# Patient Record
Sex: Female | Born: 1975 | Race: Black or African American | Hispanic: No | Marital: Single | Smoking: Current every day smoker
Health system: Southern US, Community
[De-identification: ages and names within clinical notes are randomized; demographics above are authoritative.]

## PROBLEM LIST (undated history)

## (undated) DIAGNOSIS — F32A Depression, unspecified: Secondary | ICD-10-CM

## (undated) DIAGNOSIS — F419 Anxiety disorder, unspecified: Secondary | ICD-10-CM

## (undated) DIAGNOSIS — F319 Bipolar disorder, unspecified: Secondary | ICD-10-CM

## (undated) DIAGNOSIS — F329 Major depressive disorder, single episode, unspecified: Secondary | ICD-10-CM

## (undated) DIAGNOSIS — R569 Unspecified convulsions: Secondary | ICD-10-CM

## (undated) DIAGNOSIS — J45909 Unspecified asthma, uncomplicated: Secondary | ICD-10-CM

## (undated) HISTORY — PX: TUBAL LIGATION: SHX77

---

## 1998-01-17 ENCOUNTER — Inpatient Hospital Stay (HOSPITAL_COMMUNITY): Admission: AD | Admit: 1998-01-17 | Discharge: 1998-01-17 | Payer: Self-pay | Admitting: Obstetrics

## 1998-03-23 ENCOUNTER — Inpatient Hospital Stay (HOSPITAL_COMMUNITY): Admission: AD | Admit: 1998-03-23 | Discharge: 1998-03-23 | Payer: Self-pay | Admitting: Obstetrics

## 1998-03-23 ENCOUNTER — Emergency Department (HOSPITAL_COMMUNITY): Admission: EM | Admit: 1998-03-23 | Discharge: 1998-03-23 | Payer: Self-pay | Admitting: Emergency Medicine

## 1999-07-11 ENCOUNTER — Inpatient Hospital Stay (HOSPITAL_COMMUNITY): Admission: AD | Admit: 1999-07-11 | Discharge: 1999-07-11 | Payer: Self-pay | Admitting: Obstetrics

## 1999-07-20 ENCOUNTER — Ambulatory Visit (HOSPITAL_COMMUNITY): Admission: RE | Admit: 1999-07-20 | Discharge: 1999-07-20 | Payer: Self-pay | Admitting: Obstetrics & Gynecology

## 1999-07-25 ENCOUNTER — Inpatient Hospital Stay (HOSPITAL_COMMUNITY): Admission: AD | Admit: 1999-07-25 | Discharge: 1999-07-25 | Payer: Self-pay | Admitting: *Deleted

## 2001-09-27 ENCOUNTER — Inpatient Hospital Stay (HOSPITAL_COMMUNITY): Admission: EM | Admit: 2001-09-27 | Discharge: 2001-10-01 | Payer: Self-pay | Admitting: Psychiatry

## 2001-12-27 ENCOUNTER — Emergency Department (HOSPITAL_COMMUNITY): Admission: EM | Admit: 2001-12-27 | Discharge: 2001-12-27 | Payer: Self-pay | Admitting: Emergency Medicine

## 2002-05-24 ENCOUNTER — Emergency Department (HOSPITAL_COMMUNITY): Admission: EM | Admit: 2002-05-24 | Discharge: 2002-05-24 | Payer: Self-pay | Admitting: Emergency Medicine

## 2002-09-06 ENCOUNTER — Emergency Department (HOSPITAL_COMMUNITY): Admission: EM | Admit: 2002-09-06 | Discharge: 2002-09-06 | Payer: Self-pay

## 2002-11-30 ENCOUNTER — Emergency Department (HOSPITAL_COMMUNITY): Admission: EM | Admit: 2002-11-30 | Discharge: 2002-11-30 | Payer: Self-pay | Admitting: Emergency Medicine

## 2004-05-29 ENCOUNTER — Emergency Department (HOSPITAL_COMMUNITY): Admission: EM | Admit: 2004-05-29 | Discharge: 2004-05-29 | Payer: Self-pay | Admitting: Emergency Medicine

## 2004-12-02 ENCOUNTER — Emergency Department (HOSPITAL_COMMUNITY): Admission: AD | Admit: 2004-12-02 | Discharge: 2004-12-02 | Payer: Self-pay | Admitting: Emergency Medicine

## 2004-12-15 ENCOUNTER — Inpatient Hospital Stay (HOSPITAL_COMMUNITY): Admission: AD | Admit: 2004-12-15 | Discharge: 2004-12-15 | Payer: Self-pay | Admitting: Obstetrics

## 2005-01-08 ENCOUNTER — Emergency Department (HOSPITAL_COMMUNITY): Admission: EM | Admit: 2005-01-08 | Discharge: 2005-01-08 | Payer: Self-pay | Admitting: Emergency Medicine

## 2005-06-19 ENCOUNTER — Emergency Department (HOSPITAL_COMMUNITY): Admission: EM | Admit: 2005-06-19 | Discharge: 2005-06-20 | Payer: Self-pay | Admitting: Emergency Medicine

## 2005-07-03 ENCOUNTER — Emergency Department (HOSPITAL_COMMUNITY): Admission: EM | Admit: 2005-07-03 | Discharge: 2005-07-04 | Payer: Self-pay | Admitting: Emergency Medicine

## 2005-12-16 ENCOUNTER — Emergency Department (HOSPITAL_COMMUNITY): Admission: EM | Admit: 2005-12-16 | Discharge: 2005-12-16 | Payer: Self-pay | Admitting: Emergency Medicine

## 2006-05-03 ENCOUNTER — Emergency Department (HOSPITAL_COMMUNITY): Admission: EM | Admit: 2006-05-03 | Discharge: 2006-05-03 | Payer: Self-pay | Admitting: *Deleted

## 2006-06-08 ENCOUNTER — Emergency Department (HOSPITAL_COMMUNITY): Admission: EM | Admit: 2006-06-08 | Discharge: 2006-06-08 | Payer: Self-pay | Admitting: Family Medicine

## 2006-06-23 ENCOUNTER — Emergency Department (HOSPITAL_COMMUNITY): Admission: EM | Admit: 2006-06-23 | Discharge: 2006-06-23 | Payer: Self-pay | Admitting: Emergency Medicine

## 2006-09-11 ENCOUNTER — Emergency Department (HOSPITAL_COMMUNITY): Admission: EM | Admit: 2006-09-11 | Discharge: 2006-09-11 | Payer: Self-pay | Admitting: Emergency Medicine

## 2007-05-21 ENCOUNTER — Emergency Department (HOSPITAL_COMMUNITY): Admission: EM | Admit: 2007-05-21 | Discharge: 2007-05-22 | Payer: Self-pay | Admitting: Emergency Medicine

## 2008-12-15 ENCOUNTER — Emergency Department (HOSPITAL_COMMUNITY): Admission: EM | Admit: 2008-12-15 | Discharge: 2008-12-15 | Payer: Self-pay | Admitting: Emergency Medicine

## 2009-05-11 ENCOUNTER — Emergency Department (HOSPITAL_COMMUNITY): Admission: EM | Admit: 2009-05-11 | Discharge: 2009-05-11 | Payer: Self-pay | Admitting: Emergency Medicine

## 2009-12-10 ENCOUNTER — Emergency Department (HOSPITAL_COMMUNITY): Admission: EM | Admit: 2009-12-10 | Discharge: 2009-12-11 | Payer: Self-pay | Admitting: Emergency Medicine

## 2010-09-22 ENCOUNTER — Ambulatory Visit: Payer: Self-pay | Admitting: Nurse Practitioner

## 2011-01-03 ENCOUNTER — Emergency Department (HOSPITAL_COMMUNITY)
Admission: EM | Admit: 2011-01-03 | Discharge: 2011-01-04 | Disposition: A | Discharge status: Home / Self Care | Payer: Medicaid Other | Attending: Emergency Medicine | Admitting: Emergency Medicine

## 2011-01-03 DIAGNOSIS — F329 Major depressive disorder, single episode, unspecified: Secondary | ICD-10-CM | POA: Insufficient documentation

## 2011-01-03 DIAGNOSIS — F3289 Other specified depressive episodes: Secondary | ICD-10-CM | POA: Insufficient documentation

## 2011-01-03 DIAGNOSIS — R51 Headache: Secondary | ICD-10-CM | POA: Insufficient documentation

## 2011-01-03 DIAGNOSIS — M25569 Pain in unspecified knee: Secondary | ICD-10-CM | POA: Insufficient documentation

## 2011-01-03 DIAGNOSIS — D72829 Elevated white blood cell count, unspecified: Secondary | ICD-10-CM | POA: Insufficient documentation

## 2011-01-03 DIAGNOSIS — G40909 Epilepsy, unspecified, not intractable, without status epilepticus: Secondary | ICD-10-CM | POA: Insufficient documentation

## 2011-01-03 DIAGNOSIS — R112 Nausea with vomiting, unspecified: Secondary | ICD-10-CM | POA: Insufficient documentation

## 2011-01-03 DIAGNOSIS — R0609 Other forms of dyspnea: Secondary | ICD-10-CM | POA: Insufficient documentation

## 2011-01-03 DIAGNOSIS — Z79899 Other long term (current) drug therapy: Secondary | ICD-10-CM | POA: Insufficient documentation

## 2011-01-03 DIAGNOSIS — J45909 Unspecified asthma, uncomplicated: Secondary | ICD-10-CM | POA: Insufficient documentation

## 2011-01-03 DIAGNOSIS — R0989 Other specified symptoms and signs involving the circulatory and respiratory systems: Secondary | ICD-10-CM | POA: Insufficient documentation

## 2011-01-03 DIAGNOSIS — R079 Chest pain, unspecified: Secondary | ICD-10-CM | POA: Insufficient documentation

## 2011-01-03 DIAGNOSIS — M79609 Pain in unspecified limb: Secondary | ICD-10-CM | POA: Insufficient documentation

## 2011-01-04 ENCOUNTER — Emergency Department (HOSPITAL_COMMUNITY): Payer: Medicaid Other

## 2011-01-04 LAB — POCT CARDIAC MARKERS
CKMB, poc: <1 ng/mL — ABNORMAL LOW (ref 1.0–8.0)
Myoglobin, poc: 39.2 ng/mL (ref 12–200)
Myoglobin, poc: 41.3 ng/mL (ref 12–200)
Troponin i, poc: <0.05 ng/mL (ref 0.00–0.09)

## 2011-01-04 LAB — DIFFERENTIAL
Basophils Absolute: 0 10*3/uL (ref 0.0–0.1)
Eosinophils Relative: 0 % (ref 0–5)
Lymphs Abs: 2.8 10*3/uL (ref 0.7–4.0)
Monocytes Absolute: 1.6 10*3/uL — ABNORMAL HIGH (ref 0.1–1.0)
Monocytes Relative: 8 % (ref 3–12)
Neutrophils Relative %: 78 % — ABNORMAL HIGH (ref 43–77)

## 2011-01-04 LAB — POCT I-STAT, CHEM 8
BUN: 9 mg/dL (ref 6–23)
Calcium, Ion: 1.14 mmol/L (ref 1.12–1.32)
Chloride: 104 mEq/L (ref 96–112)
HCT: 41 % (ref 36.0–46.0)
Sodium: 140 mEq/L (ref 135–145)

## 2011-01-04 LAB — CBC
HCT: 34.9 % — ABNORMAL LOW (ref 36.0–46.0)
MCV: 76.2 fL — ABNORMAL LOW (ref 78.0–100.0)
Platelets: 305 10*3/uL (ref 150–400)
RBC: 4.58 MIL/uL (ref 3.87–5.11)
RDW: 14.2 % (ref 11.5–15.5)
WBC: 20 10*3/uL — ABNORMAL HIGH (ref 4.0–10.5)

## 2011-01-15 LAB — POCT I-STAT, CHEM 8
BUN: 4 mg/dL — ABNORMAL LOW (ref 6–23)
Calcium, Ion: 1.09 mmol/L — ABNORMAL LOW (ref 1.12–1.32)
Chloride: 104 mEq/L (ref 96–112)
Creatinine, Ser: 0.7 mg/dL (ref 0.4–1.2)

## 2011-01-15 LAB — URINALYSIS, ROUTINE W REFLEX MICROSCOPIC
Glucose, UA: NEGATIVE mg/dL
Hgb urine dipstick: NEGATIVE
pH: 6 (ref 5.0–8.0)

## 2011-01-15 LAB — DIFFERENTIAL
Basophils Absolute: 0.1 10*3/uL (ref 0.0–0.1)
Eosinophils Relative: 1 % (ref 0–5)
Lymphocytes Relative: 46 % (ref 12–46)
Lymphs Abs: 5 10*3/uL — ABNORMAL HIGH (ref 0.7–4.0)
Monocytes Absolute: 0.4 10*3/uL (ref 0.1–1.0)
Monocytes Relative: 4 % (ref 3–12)
Neutro Abs: 5.4 10*3/uL (ref 1.7–7.7)

## 2011-01-15 LAB — CBC
HCT: 35.9 % — ABNORMAL LOW (ref 36.0–46.0)
Hemoglobin: 11.4 g/dL — ABNORMAL LOW (ref 12.0–15.0)
RBC: 4.41 MIL/uL (ref 3.87–5.11)
RDW: 14.4 % (ref 11.5–15.5)
WBC: 11 10*3/uL — ABNORMAL HIGH (ref 4.0–10.5)

## 2011-01-15 LAB — POCT PREGNANCY, URINE: Preg Test, Ur: NEGATIVE
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
## 2011-07-25 LAB — COMPREHENSIVE METABOLIC PANEL
Alkaline Phosphatase: 43
BUN: 6
Chloride: 110
Creatinine, Ser: 0.7
Glucose, Bld: 97
Potassium: 4.1
Total Bilirubin: 0.3
Total Protein: 7.2

## 2011-07-25 LAB — POCT PREGNANCY, URINE: Operator id: 27011

## 2011-07-25 LAB — DIFFERENTIAL
Basophils Absolute: 0.2 — ABNORMAL HIGH
Basophils Relative: 2 — ABNORMAL HIGH
Neutro Abs: 5.8
Neutrophils Relative %: 47

## 2011-07-25 LAB — URINALYSIS, ROUTINE W REFLEX MICROSCOPIC
Ketones, ur: NEGATIVE
Nitrite: NEGATIVE
Protein, ur: NEGATIVE
Urobilinogen, UA: 0.2

## 2011-07-25 LAB — CBC
HCT: 33.6 — ABNORMAL LOW
Hemoglobin: 10.7 — ABNORMAL LOW
MCV: 79.5
RDW: 14.8 — ABNORMAL HIGH

## 2011-07-25 LAB — RAPID URINE DRUG SCREEN, HOSP PERFORMED
Benzodiazepines: NOT DETECTED
Cocaine: NOT DETECTED
Tetrahydrocannabinol: POSITIVE — AB

## 2011-07-25 LAB — ETHANOL: Alcohol, Ethyl (B): 217 — ABNORMAL HIGH

## 2012-04-22 ENCOUNTER — Encounter (HOSPITAL_COMMUNITY): Payer: Self-pay | Admitting: Emergency Medicine

## 2012-04-22 ENCOUNTER — Emergency Department (HOSPITAL_COMMUNITY): Payer: Medicaid Other

## 2012-04-22 ENCOUNTER — Emergency Department (HOSPITAL_COMMUNITY)
Admission: EM | Admit: 2012-04-22 | Discharge: 2012-04-22 | Disposition: A | Discharge status: Home / Self Care | Payer: Medicaid Other | Attending: Emergency Medicine | Admitting: Emergency Medicine

## 2012-04-22 DIAGNOSIS — IMO0002 Reserved for concepts with insufficient information to code with codable children: Secondary | ICD-10-CM | POA: Insufficient documentation

## 2012-04-22 DIAGNOSIS — S0990XA Unspecified injury of head, initial encounter: Secondary | ICD-10-CM | POA: Insufficient documentation

## 2012-04-22 DIAGNOSIS — T07XXXA Unspecified multiple injuries, initial encounter: Secondary | ICD-10-CM | POA: Insufficient documentation

## 2012-04-22 MED ORDER — ACETAMINOPHEN 325 MG PO TABS
ORAL_TABLET | ORAL | Status: AC
  Administered 2012-04-22: 650 mg via ORAL
  Filled 2012-04-22: qty 2

## 2012-04-22 MED ORDER — OXYCODONE-ACETAMINOPHEN 5-325 MG PO TABS
2.0000 | ORAL_TABLET | Freq: Once | ORAL | Status: AC
  Administered 2012-04-22: 2 via ORAL
  Filled 2012-04-22: qty 2

## 2012-04-22 MED ORDER — ACETAMINOPHEN 325 MG PO TABS
650.0000 mg | ORAL_TABLET | Freq: Once | ORAL | Status: AC
  Administered 2012-04-22: 650 mg via ORAL

## 2012-04-22 MED ORDER — BACITRACIN ZINC 500 UNIT/GM EX OINT
1.0000 "application " | TOPICAL_OINTMENT | Freq: Two times a day (BID) | CUTANEOUS | Status: DC
  Administered 2012-04-22: 1 via TOPICAL
  Filled 2012-04-22 (×2): qty 0.9

## 2012-04-22 MED ORDER — NAPROXEN 500 MG PO TABS: 500.0000 mg | ORAL_TABLET | Freq: Two times a day (BID) | ORAL | Status: DC

## 2012-04-22 MED ORDER — TETANUS-DIPHTH-ACELL PERTUSSIS 5-2.5-18.5 LF-MCG/0.5 IM SUSP
0.5000 mL | Freq: Once | INTRAMUSCULAR | Status: AC
  Administered 2012-04-22: 0.5 mL via INTRAMUSCULAR
  Filled 2012-04-22: qty 0.5
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
## 2012-04-22 NOTE — ED Notes (Signed)
Pt for discharge.Vital signs stable and GCS 15 

## 2012-04-22 NOTE — ED Notes (Signed)
Back board removed by Dr.Miller.
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
## 2012-05-25 ENCOUNTER — Encounter (HOSPITAL_COMMUNITY): Payer: Self-pay | Admitting: Nurse Practitioner

## 2012-05-25 ENCOUNTER — Emergency Department (HOSPITAL_COMMUNITY)
Admission: EM | Admit: 2012-05-25 | Discharge: 2012-05-25 | Disposition: A | Discharge status: Home / Self Care | Payer: Medicaid Other | Attending: Emergency Medicine | Admitting: Emergency Medicine

## 2012-05-25 DIAGNOSIS — A599 Trichomoniasis, unspecified: Secondary | ICD-10-CM | POA: Insufficient documentation

## 2012-05-25 DIAGNOSIS — R109 Unspecified abdominal pain: Secondary | ICD-10-CM

## 2012-05-25 DIAGNOSIS — K5900 Constipation, unspecified: Secondary | ICD-10-CM

## 2012-05-25 DIAGNOSIS — Z79899 Other long term (current) drug therapy: Secondary | ICD-10-CM | POA: Insufficient documentation

## 2012-05-25 HISTORY — DX: Unspecified convulsions: R56.9

## 2012-05-25 LAB — URINE MICROSCOPIC-ADD ON

## 2012-05-25 LAB — URINALYSIS, ROUTINE W REFLEX MICROSCOPIC
Bilirubin Urine: NEGATIVE
Glucose, UA: NEGATIVE mg/dL
Hgb urine dipstick: NEGATIVE
Ketones, ur: NEGATIVE mg/dL
Nitrite: NEGATIVE
Protein, ur: NEGATIVE mg/dL
Specific Gravity, Urine: 1.014 (ref 1.005–1.030)
Urobilinogen, UA: 0.2 mg/dL (ref 0.0–1.0)
pH: 5.5 (ref 5.0–8.0)

## 2012-05-25 LAB — POCT PREGNANCY, URINE: Preg Test, Ur: NEGATIVE

## 2012-05-25 LAB — WET PREP, GENITAL
Clue Cells Wet Prep HPF POC: NONE SEEN
Yeast Wet Prep HPF POC: NONE SEEN

## 2012-05-25 MED ORDER — IBUPROFEN 800 MG PO TABS
800.0000 mg | ORAL_TABLET | Freq: Once | ORAL | Status: AC
  Administered 2012-05-25: 800 mg via ORAL
  Filled 2012-05-25: qty 1

## 2012-05-25 MED ORDER — METRONIDAZOLE 500 MG PO TABS: 500.0000 mg | ORAL_TABLET | Freq: Two times a day (BID) | ORAL | Status: DC

## 2012-05-25 MED ORDER — HYDROCODONE-ACETAMINOPHEN 5-325 MG PO TABS
1.0000 | ORAL_TABLET | Freq: Once | ORAL | Status: AC
Start: 2012-05-25 — End: 2012-05-25
  Administered 2012-05-25: 1 via ORAL
  Filled 2012-05-25: qty 1

## 2012-05-25 NOTE — ED Notes (Signed)
Pt is aware of the urine sample needed, pt states that they are unable to use the bathroom at this time

## 2012-05-25 NOTE — ED Provider Notes (Signed)
History     CSN: [id number]  Arrival date & time 05/25/12  1759   First MD Initiated Contact with Patient 05/25/12 1920      Chief Complaint  Patient presents with  . Abdominal Pain    (Consider location/radiation/quality/duration/timing/severity/associated sxs/prior treatment) Patient is a 36 y.o. female presenting with abdominal pain. The history is provided by the patient.  Abdominal Pain The primary symptoms of the illness include abdominal pain. The primary symptoms of the illness do not include fever, shortness of breath, nausea, vomiting, dysuria, vaginal discharge or vaginal bleeding. The current episode started yesterday. The onset of the illness was gradual. The problem has been gradually worsening.  The abdominal pain is located in the suprapubic region. The abdominal pain does not radiate. The severity of the abdominal pain is 6/10. The abdominal pain is relieved by bowel movement.  The patient states that she believes she is currently not pregnant. Additional symptoms associated with the illness include constipation. Symptoms associated with the illness do not include chills, urgency, hematuria, frequency or back pain.    Past Medical History  Diagnosis Date  . Seizures     History reviewed. No pertinent past surgical history.  History reviewed. No pertinent family history.  History  Substance Use Topics  . Smoking status: Never Smoker   . Smokeless tobacco: Not on file  . Alcohol Use: No    OB History    Grav Para Term Preterm Abortions TAB SAB Ect Mult Living                  Review of Systems  Constitutional: Negative for fever and chills.  HENT: Negative.   Respiratory: Negative for shortness of breath.   Cardiovascular: Negative for chest pain.  Gastrointestinal: Positive for abdominal pain and constipation. Negative for nausea and vomiting.  Genitourinary: Negative for dysuria, urgency, frequency, hematuria, flank pain, decreased urine volume,  vaginal bleeding, vaginal discharge and difficulty urinating.  Musculoskeletal: Negative for back pain.  Neurological: Negative for headaches.  Hematological: Negative.   Psychiatric/Behavioral: Negative.   All other systems reviewed and are negative.    Allergies  Review of patient's allergies indicates no known allergies.  Home Medications   Current Outpatient Rx  Name Route Sig Dispense Refill  . ALBUTEROL SULFATE HFA 108 (90 BASE) MCG/ACT IN AERS Inhalation Inhale 2 puffs into the lungs every 6 (six) hours as needed. For shortness of breath    . BISACODYL 5 MG PO TBEC Oral Take 10-20 mg by mouth daily as needed. For constipation    . IBUPROFEN 200 MG PO TABS Oral Take 400 mg by mouth every 6 (six) hours as needed. For pain    . LAMOTRIGINE 150 MG PO TABS Oral Take 150 mg by mouth daily.    Marland Kitchen MIRTAZAPINE 45 MG PO TABS Oral Take 45 mg by mouth at bedtime.    Marland Kitchen NAPHAZOLINE-PHENIRAMINE 0.025-0.3 % OP SOLN Both Eyes Place 2 drops into both eyes 4 (four) times daily as needed. For irritation    . SERTRALINE HCL 50 MG PO TABS Oral Take 25 mg by mouth daily.      BP 130/91  Pulse 100  Temp 98.1 F (36.7 C) (Oral)  Resp 18  SpO2 100%  Physical Exam  Nursing note and vitals reviewed. Constitutional: She is oriented to person, place, and time. She appears well-developed and well-nourished. No distress.  HENT:  Head: Normocephalic and atraumatic.  Right Ear: External ear normal.  Left Ear: External ear  normal.  Nose: Nose normal.  Mouth/Throat: Oropharynx is clear and moist.  Neck: Neck supple.  Cardiovascular: Normal rate, regular rhythm, normal heart sounds and intact distal pulses.   Pulmonary/Chest: Effort normal and breath sounds normal. No respiratory distress. She has no wheezes. She has no rales.  Abdominal: Soft. She exhibits no distension. There is tenderness (minimal - easily distractable) in the suprapubic area. There is no rigidity, no rebound and no guarding.    Genitourinary: Cervix exhibits no motion tenderness and no discharge. Right adnexum displays no mass and no tenderness. Left adnexum displays no mass and no tenderness. No tenderness or bleeding around the vagina. No vaginal discharge found.    Musculoskeletal: She exhibits no tenderness.  Neurological: She is alert and oriented to person, place, and time.  Skin: Skin is warm and dry. She is not diaphoretic. No pallor.    ED Course  Procedures (including critical care time)  Labs Reviewed  URINALYSIS, ROUTINE W REFLEX MICROSCOPIC - Abnormal; Notable for the following:    Leukocytes, UA MODERATE (*)     All other components within normal limits  WET PREP, GENITAL - Abnormal; Notable for the following:    Trich, Wet Prep MODERATE (*)     WBC, Wet Prep HPF POC MANY (*)     All other components within normal limits  URINE MICROSCOPIC-ADD ON - Abnormal; Notable for the following:    Squamous Epithelial / LPF FEW (*)     All other components within normal limits  POCT PREGNANCY, URINE  GC/CHLAMYDIA PROBE AMP, GENITAL  URINE CULTURE   No results found.   1. Abdominal pain   2. Constipation   3. Trichomonas       MDM  36 yo female with lower abd pain for 2 days as well as constipation that has been partially relieved with laxatives. Pain improved some with laxatives. Cervix shows mild tenderness but no CMT or lateral pain suggestive of ovarian pain/torsion. No RLQ tenderness to suggest appendicitis. Pain seems likely to be coming from Trichomonas, will treat with ibuprofen as well as flagyl. Discussed return precautions, need for GYN follow up for pap smear based on abnormal exam here (last pap in 2010) as well as no alcohol while on flagyl.         Pricilla Loveless, MD 05/25/12 [phone]

## 2012-05-25 NOTE — ED Notes (Signed)
Pelvic cart at the bedside 

## 2012-05-25 NOTE — ED Notes (Signed)
Per ems: pt c/o lower abd pain increasingly worse over past 2 days. Took laxative which caused diarrhea. Denies possibility of pregnancy. Reports decreased appetite x 2 days. Pain is intermittent 8/10.

## 2012-05-26 LAB — GC/CHLAMYDIA PROBE AMP, GENITAL
Chlamydia, DNA Probe: NEGATIVE
GC Probe Amp, Genital: NEGATIVE

## 2012-05-27 LAB — URINE CULTURE
Colony Count: NO GROWTH
Culture: NO GROWTH

## 2012-06-02 NOTE — ED Provider Notes (Signed)
I saw and evaluated the patient, reviewed the resident's note and I agree with the findings and plan.  Lower abd pain without rebound, not surgical abd.  No fever.  Pelvic exam performed by Dr. Criss Alvine with results noted.  Treat for trich STD and pt can follow up with primary OBGYN or county clinic.  Cultures can be called in to her if GC/Chl are positive.    Gavin Pound. Oletta Lamas, MD 06/02/12 [phone]

## 2012-06-22 ENCOUNTER — Other Ambulatory Visit (HOSPITAL_COMMUNITY): Payer: Self-pay | Admitting: Obstetrics

## 2012-06-22 DIAGNOSIS — Z1231 Encounter for screening mammogram for malignant neoplasm of breast: Secondary | ICD-10-CM

## 2012-07-03 ENCOUNTER — Ambulatory Visit (HOSPITAL_COMMUNITY): Admission: RE | Admit: 2012-07-03 | Payer: Medicaid Other | Source: Ambulatory Visit

## 2012-12-04 ENCOUNTER — Emergency Department (HOSPITAL_COMMUNITY): Payer: Medicaid Other

## 2012-12-04 ENCOUNTER — Encounter (HOSPITAL_COMMUNITY): Payer: Self-pay | Admitting: Emergency Medicine

## 2012-12-04 ENCOUNTER — Emergency Department (HOSPITAL_COMMUNITY)
Admission: EM | Admit: 2012-12-04 | Discharge: 2012-12-04 | Disposition: A | Discharge status: Home / Self Care | Payer: Medicaid Other | Attending: Emergency Medicine | Admitting: Emergency Medicine

## 2012-12-04 DIAGNOSIS — W19XXXA Unspecified fall, initial encounter: Secondary | ICD-10-CM

## 2012-12-04 DIAGNOSIS — S6990XA Unspecified injury of unspecified wrist, hand and finger(s), initial encounter: Secondary | ICD-10-CM | POA: Insufficient documentation

## 2012-12-04 DIAGNOSIS — S59909A Unspecified injury of unspecified elbow, initial encounter: Secondary | ICD-10-CM | POA: Insufficient documentation

## 2012-12-04 DIAGNOSIS — R443 Hallucinations, unspecified: Secondary | ICD-10-CM | POA: Insufficient documentation

## 2012-12-04 DIAGNOSIS — W010XXA Fall on same level from slipping, tripping and stumbling without subsequent striking against object, initial encounter: Secondary | ICD-10-CM | POA: Insufficient documentation

## 2012-12-04 DIAGNOSIS — M2550 Pain in unspecified joint: Secondary | ICD-10-CM | POA: Insufficient documentation

## 2012-12-04 DIAGNOSIS — Z79899 Other long term (current) drug therapy: Secondary | ICD-10-CM | POA: Insufficient documentation

## 2012-12-04 DIAGNOSIS — M25531 Pain in right wrist: Secondary | ICD-10-CM

## 2012-12-04 DIAGNOSIS — Y929 Unspecified place or not applicable: Secondary | ICD-10-CM | POA: Insufficient documentation

## 2012-12-04 DIAGNOSIS — Y9301 Activity, walking, marching and hiking: Secondary | ICD-10-CM | POA: Insufficient documentation

## 2012-12-04 DIAGNOSIS — Z760 Encounter for issue of repeat prescription: Secondary | ICD-10-CM

## 2012-12-04 DIAGNOSIS — F319 Bipolar disorder, unspecified: Secondary | ICD-10-CM | POA: Insufficient documentation

## 2012-12-04 DIAGNOSIS — G40909 Epilepsy, unspecified, not intractable, without status epilepticus: Secondary | ICD-10-CM | POA: Insufficient documentation

## 2012-12-04 MED ORDER — MIRTAZAPINE 45 MG PO TABS: 45.0000 mg | ORAL_TABLET | Freq: Every day | ORAL | Status: DC

## 2012-12-04 MED ORDER — SERTRALINE HCL 50 MG PO TABS: 25.0000 mg | ORAL_TABLET | Freq: Every day | ORAL | Status: DC

## 2012-12-04 MED ORDER — OLANZAPINE 15 MG PO TABS: 15.0000 mg | ORAL_TABLET | Freq: Every day | ORAL | Status: DC

## 2012-12-04 MED ORDER — DIVALPROEX SODIUM ER 250 MG PO TB24: 250.0000 mg | ORAL_TABLET | Freq: Every day | ORAL | Status: DC

## 2012-12-04 MED ORDER — ALBUTEROL SULFATE HFA 108 (90 BASE) MCG/ACT IN AERS
2.0000 | INHALATION_SPRAY | Freq: Four times a day (QID) | RESPIRATORY_TRACT | Status: DC
  Administered 2012-12-04: 2 via RESPIRATORY_TRACT
  Filled 2012-12-04: qty 6.7

## 2012-12-04 NOTE — ED Notes (Signed)
Pt states she is short of breath, but wants to know how long she will have to wait before she can outside to smoke. Pt wants to refuse x-ray so she can leave sooner. Pt also asking for bus pass for herself and her companion.

## 2012-12-04 NOTE — ED Notes (Signed)
Ortho tech called for application of thumb spica.  

## 2012-12-04 NOTE — ED Provider Notes (Signed)
History    This chart was scribed for non-physician practitioner working with Gerhard Munch, MD by Sofie Rower, ED Scribe. This patient was seen in room WTR9/WTR9 and the patient's care was started at 4:47PM.    CSN: [id number]  Arrival date & time 12/04/12  1432   First MD Initiated Contact with Patient 12/04/12 1647      Chief Complaint  Patient presents with  . Anxiety    (Consider location/radiation/quality/duration/timing/severity/associated sxs/prior treatment) The history is provided by the patient and the spouse. No language interpreter was used.    Erika Garcia is a 37 y.o. female , arriving via. EMS, with a hx of bipolar disorder and seizures, who presents to the Emergency Department with a chief complaint of anxiety, complaining of gradual, progressively worsening, depression, onset two days ago (12/02/12).  Associated symptoms include auditory hallucinations. The pt reports she is unable to acquire her scheduled medications at the present point in time (they are located within a woman's home, where which the pt does not currently have access), she feels depressed, and desires to go home. Furthermore, the pt informs she experiences auditory hallucinations, which tell her to do good deeds. Additionally, the pt presents to the Emergency department complaining of sudden, progressively worsening wrist pain located at the right wrist, onset yesterday (12/03/12). The pt informs she was walking, when she suddenly tripped, fell, and impacted upon her outstretched right wrist. Modifying factors include certain movements and positions of the right wrist which intensify the wrist pain.   The pt denies any other medical problems at the present point and time.   The pt does not smoke or drink alcohol.   The pt is currently followed by the [facility] with regards to her anxiety.   Pt told RN that she had chest discomfort, but when directly questioned she states a Hx of chronic  bronchitis and she has also been without her inhaler for a few days.  This is her reason for chest discomfort.  She denies CP, SOB, nausea, vomiting, lightheadedness, dizziness, syncope.      Past Medical History  Diagnosis Date  . Seizures     History reviewed. No pertinent past surgical history.  No family history on file.  History  Substance Use Topics  . Smoking status: Never Smoker   . Smokeless tobacco: Not on file  . Alcohol Use: No    OB History   Grav Para Term Preterm Abortions TAB SAB Ect Mult Living                  Review of Systems  Constitutional: Negative for fever, diaphoresis, appetite change, fatigue and unexpected weight change.  HENT: Negative for mouth sores and neck stiffness.   Eyes: Negative for visual disturbance.  Respiratory: Negative for cough, chest tightness, shortness of breath and wheezing.   Cardiovascular: Negative for chest pain.  Gastrointestinal: Negative for nausea, vomiting, abdominal pain, diarrhea and constipation.  Endocrine: Negative for polydipsia, polyphagia and polyuria.  Genitourinary: Negative for dysuria, urgency, frequency and hematuria.  Musculoskeletal: Positive for arthralgias. Negative for back pain.  Skin: Negative for rash.  Allergic/Immunologic: Negative for immunocompromised state.  Neurological: Negative for syncope, light-headedness and headaches.  Hematological: Does not bruise/bleed easily.  Psychiatric/Behavioral: Positive for hallucinations and dysphoric mood. Negative for sleep disturbance. The patient is not nervous/anxious.   All other systems reviewed and are negative.    Allergies  Review of patient's allergies indicates no known allergies.  Home Medications  Current Outpatient Rx  Name  Route  Sig  Dispense  Refill  . QUEtiapine Fumarate (SEROQUEL PO)   Oral   Take by mouth daily.         . divalproex (DEPAKOTE ER) 250 MG 24 hr tablet   Oral   Take 1 tablet (250 mg total) by mouth  daily.   3 tablet   0   . mirtazapine (REMERON) 45 MG tablet   Oral   Take 1 tablet (45 mg total) by mouth at bedtime.   3 tablet   0   . OLANZapine (ZYPREXA) 15 MG tablet   Oral   Take 1 tablet (15 mg total) by mouth at bedtime.   3 tablet   0   . sertraline (ZOLOFT) 50 MG tablet   Oral   Take 0.5 tablets (25 mg total) by mouth daily.   3 tablet   0     BP 123/84  Pulse 92  Temp(Src) 98.7 F (37.1 C) (Oral)  Resp 18  SpO2 100%  LMP 12/04/2012  Physical Exam  Nursing note and vitals reviewed. Constitutional: She is oriented to person, place, and time. She appears well-developed and well-nourished. No distress.  HENT:  Head: Normocephalic and atraumatic.  Mouth/Throat: Oropharynx is clear and moist. No oropharyngeal exudate.  Eyes: Conjunctivae and EOM are normal. Pupils are equal, round, and reactive to light. No scleral icterus.  Neck: Normal range of motion. Neck supple.  Cardiovascular: Normal rate, regular rhythm, normal heart sounds and intact distal pulses.  Exam reveals no gallop and no friction rub.   No murmur heard. Capillary refill less than 3 sec.   Pulmonary/Chest: Effort normal. No accessory muscle usage. Not tachypneic. No respiratory distress. She has decreased breath sounds (throughout). She has no wheezes. She has no rales. She exhibits no tenderness.  Abdominal: Soft. Bowel sounds are normal. She exhibits no distension and no mass. There is no tenderness. There is no rebound and no guarding.  Musculoskeletal: Normal range of motion. She exhibits no edema.  Right wrist: Good ROM. Tender to palpitation on the medical and lateral wrist. Ecchymosis on the dorsal aspect.   Lymphadenopathy:    She has no cervical adenopathy.  Neurological: She is alert and oriented to person, place, and time. She exhibits normal muscle tone. Coordination normal.  Speech is clear and goal oriented Moves extremities without ataxia. Strength is 5/5. Sensation is intact.    Skin: Skin is warm and dry. No rash noted. She is not diaphoretic. No erythema.  Psychiatric: Her speech is delayed. She is actively hallucinating. Cognition and memory are not impaired. She does not express impulsivity. She exhibits a depressed mood. She expresses no homicidal and no suicidal ideation. She expresses no suicidal plans and no homicidal plans.    ED Course  Procedures (including critical care time)  DIAGNOSTIC STUDIES: Oxygen Saturation is 100% on room air, normal by my interpretation.    COORDINATION OF CARE:   5:04 PM- Treatment plan discussed with patient. Pt agrees with treatment.   5:09 PM- Treatment plan concerning x-ray of right wrist discussed with patient. Pt agrees with treatment.      ECG:  Date: 12/04/2012  Rate: 79  Rhythm: normal sinus rhythm  QRS Axis: normal  Intervals: normal  ST/T Wave abnormalities: normal  Conduction Disutrbances:none  Narrative Interpretation: nonischemic ECG  Old EKG Reviewed: unchanged    Labs Reviewed - No data to display Dg Wrist Complete Right  12/04/2012  *RADIOLOGY REPORT*  Clinical Data: Right wrist pain following fall.  RIGHT WRIST - COMPLETE 3+ VIEW  Comparison: None  Findings: No evidence of acute fracture, subluxation or dislocation identified.  No radio-opaque foreign bodies are present.  No focal bony lesions are noted.  The joint spaces are unremarkable.  IMPRESSION: No evidence of acute bony abnormality.   Original Report Authenticated By: Harmon Pier, M.D.      1. Fall, initial encounter   2. Wrist pain, right   3. Encounter for medication refill       MDM  IVAH GIRARDOT presents for medication refills and R wrist pain. X-ray of right wrist without acute bony abnormality. Patient given albuterol inhaler here in the department with resolution of "chest discomfort" and increase in tidal volume.  ECG nonischemic.  Will refill patient medications for 3 days she is to followup with [facility] for  further refills.  She is not suicidal or homicidal. I do not believe that she is a danger to herself. Auditory and visual hallucinations are not command in nature and do not indicate that she should harm herself or others.  They appear to be at baseline.  I have also discussed reasons to return immediately to the ER.  Patient expresses understanding and agrees with plan.    1. Medications: usual home medications 2. Treatment: rest, drink plenty of fluids,  3. Follow Up: Please followup with your primary doctor for discussion of your diagnoses and further evaluation after today's visit; if you do not have a primary care doctor use the resource guide provided to find one; followup with [facility] as directed  I personally performed the services described in this documentation, which was scribed in my presence. The recorded information has been reviewed and is accurate.   Erika Client Karlena Luebke, PA-C 12/04/12 1825

## 2012-12-04 NOTE — ED Notes (Signed)
Patient states she needs refills on medication-unable to get scripts/meds from previous residence

## 2012-12-04 NOTE — ED Notes (Signed)
Patient transported to X-ray 

## 2012-12-04 NOTE — ED Provider Notes (Signed)
  Medical screening examination/treatment/procedure(s) were performed by non-physician practitioner and as supervising physician I was immediately available for consultation/collaboration.    Gerhard Munch, MD 12/04/12 2322

## 2012-12-04 NOTE — ED Notes (Signed)
Pt ambulatory to exam room with steady gait. Pt calm, cooperative. States "I ordered a [place] sandwich, and no one has brought me one." Pt arrives with companion.

## 2012-12-04 NOTE — ED Notes (Signed)
Per patient, was evicted from her apartment 2 days ago, unable to get meds-has not taken meds for 2 days

## 2013-04-16 ENCOUNTER — Encounter (HOSPITAL_COMMUNITY): Payer: Self-pay | Admitting: Emergency Medicine

## 2013-04-16 ENCOUNTER — Emergency Department (HOSPITAL_COMMUNITY)
Admission: EM | Admit: 2013-04-16 | Discharge: 2013-04-16 | Disposition: A | Discharge status: Home / Self Care | Payer: Medicaid Other | Attending: Emergency Medicine | Admitting: Emergency Medicine

## 2013-04-16 DIAGNOSIS — K5289 Other specified noninfective gastroenteritis and colitis: Secondary | ICD-10-CM | POA: Insufficient documentation

## 2013-04-16 DIAGNOSIS — K529 Noninfective gastroenteritis and colitis, unspecified: Secondary | ICD-10-CM

## 2013-04-16 DIAGNOSIS — R112 Nausea with vomiting, unspecified: Secondary | ICD-10-CM | POA: Insufficient documentation

## 2013-04-16 DIAGNOSIS — Z79899 Other long term (current) drug therapy: Secondary | ICD-10-CM | POA: Insufficient documentation

## 2013-04-16 DIAGNOSIS — R197 Diarrhea, unspecified: Secondary | ICD-10-CM | POA: Insufficient documentation

## 2013-04-16 DIAGNOSIS — G40909 Epilepsy, unspecified, not intractable, without status epilepticus: Secondary | ICD-10-CM | POA: Insufficient documentation

## 2013-04-16 DIAGNOSIS — Z3202 Encounter for pregnancy test, result negative: Secondary | ICD-10-CM | POA: Insufficient documentation

## 2013-04-16 HISTORY — DX: Unspecified convulsions: R56.9

## 2013-04-16 LAB — URINALYSIS, ROUTINE W REFLEX MICROSCOPIC
Bilirubin Urine: NEGATIVE
Ketones, ur: 15 mg/dL — AB
Nitrite: NEGATIVE
Protein, ur: NEGATIVE mg/dL
pH: 6 (ref 5.0–8.0)

## 2013-04-16 LAB — CBC WITH DIFFERENTIAL/PLATELET
Basophils Absolute: 0 10*3/uL (ref 0.0–0.1)
Basophils Relative: 0 % (ref 0–1)
Eosinophils Absolute: 0 10*3/uL (ref 0.0–0.7)
HCT: 32.5 % — ABNORMAL LOW (ref 36.0–46.0)
Hemoglobin: 10.5 g/dL — ABNORMAL LOW (ref 12.0–15.0)
MCH: 25.1 pg — ABNORMAL LOW (ref 26.0–34.0)
MCHC: 32.3 g/dL (ref 30.0–36.0)
Monocytes Absolute: 0.4 10*3/uL (ref 0.1–1.0)
Monocytes Relative: 5 % (ref 3–12)
Neutrophils Relative %: 68 % (ref 43–77)
RDW: 15.1 % (ref 11.5–15.5)

## 2013-04-16 LAB — BASIC METABOLIC PANEL
BUN: 7 mg/dL (ref 6–23)
Creatinine, Ser: 0.73 mg/dL (ref 0.50–1.10)
GFR calc Af Amer: >90 mL/min (ref >90)
GFR calc non Af Amer: >90 mL/min (ref >90)

## 2013-04-16 MED ORDER — DIPHENOXYLATE-ATROPINE 2.5-0.025 MG PO TABS: 1.0000 | ORAL_TABLET | Freq: Four times a day (QID) | ORAL | Status: DC | PRN

## 2013-04-16 MED ORDER — TRAMADOL HCL 50 MG PO TABS: 50.0000 mg | ORAL_TABLET | Freq: Four times a day (QID) | ORAL | Status: DC | PRN

## 2013-04-16 MED ORDER — DIPHENOXYLATE-ATROPINE 2.5-0.025 MG PO TABS
2.0000 | ORAL_TABLET | ORAL | Status: AC
  Administered 2013-04-16: 2 via ORAL
  Filled 2013-04-16: qty 2

## 2013-04-16 MED ORDER — ONDANSETRON 4 MG PO TBDP: 4.0000 mg | ORAL_TABLET | Freq: Three times a day (TID) | ORAL | Status: DC | PRN

## 2013-04-16 NOTE — ED Provider Notes (Signed)
Medical screening examination/treatment/procedure(s) were performed by non-physician practitioner and as supervising physician I was immediately available for consultation/collaboration.   Kaisen Ackers III, MD 04/16/13 2128 

## 2013-04-16 NOTE — ED Notes (Signed)
Pt. Stated, I' have stomach pain and I've been going to the [facility] with diarrhea and I can't keep nothing on my stomach for 3 days.

## 2013-04-16 NOTE — ED Provider Notes (Signed)
History    CSN: [id number] Arrival date & time 04/16/13  4098  First MD Initiated Contact with Patient 04/16/13 [phone]     Chief Complaint  Patient presents with  . Abdominal Pain   (Consider location/radiation/quality/duration/timing/severity/associated sxs/prior Treatment) HPI Comments: Patient is a 37 year old female with a past medical history of seizures who presents with a 3 day history of abdominal pain. The pain is located in her generalized abdomen and does not radiate. The pain is described as cramping and severe. The pain started gradually and progressively worsened since the onset. No alleviating/aggravating factors. The patient has tried cranberry juice and water for symptoms without relief. Associated symptoms include NVD. Patient denies fever, headache, chest pain, SOB, dysuria, constipation, abnormal vaginal bleeding/discharge.     Patient is a 37 y.o. female presenting with abdominal pain.  Abdominal Pain Associated symptoms include abdominal pain, nausea and vomiting.   Past Medical History  Diagnosis Date  . Seizures   . Seizure    History reviewed. No pertinent past surgical history. No family history on file. History  Substance Use Topics  . Smoking status: Never Smoker   . Smokeless tobacco: Not on file  . Alcohol Use: No   OB History   Grav Para Term Preterm Abortions TAB SAB Ect Mult Living                 Review of Systems  Gastrointestinal: Positive for nausea, vomiting, abdominal pain and diarrhea.  All other systems reviewed and are negative.    Allergies  Review of patient's allergies indicates no known allergies.  Home Medications   Current Outpatient Rx  Name  Route  Sig  Dispense  Refill  . divalproex (DEPAKOTE ER) 500 MG 24 hr tablet   Oral   Take 500 mg by mouth daily.         . mirtazapine (REMERON) 45 MG tablet   Oral   Take 1 tablet (45 mg total) by mouth at bedtime.   3 tablet   0   . OLANZapine (ZYPREXA) 15 MG  tablet   Oral   Take 1 tablet (15 mg total) by mouth at bedtime.   3 tablet   0   . QUEtiapine Fumarate (SEROQUEL PO)   Oral   Take 1 capsule by mouth at bedtime.          . sertraline (ZOLOFT) 50 MG tablet   Oral   Take 0.5 tablets (25 mg total) by mouth daily.   3 tablet   0    BP 126/76  Pulse 86  Temp(Src) 100 F (37.8 C) (Oral)  Resp 20  SpO2 98% Physical Exam  Nursing note and vitals reviewed. Constitutional: She is oriented to person, place, and time. She appears well-developed and well-nourished. No distress.  HENT:  Head: Normocephalic and atraumatic.  Eyes: Conjunctivae and EOM are normal. No scleral icterus.  Neck: Normal range of motion.  Cardiovascular: Normal rate and regular rhythm.  Exam reveals no gallop and no friction rub.   No murmur heard. Pulmonary/Chest: Effort normal and breath sounds normal. She has no wheezes. She has no rales. She exhibits no tenderness.  Abdominal: Soft. She exhibits no distension. There is tenderness. There is no rebound and no guarding.  Mild generalized tenderness to palpation. No peritoneal signs.   Musculoskeletal: Normal range of motion.  Neurological: She is alert and oriented to person, place, and time. Coordination normal.  Speech is goal-oriented. Moves limbs without ataxia.  Skin: Skin is warm and dry.  Psychiatric: She has a normal mood and affect. Her behavior is normal.    ED Course  Procedures (including critical care time) Labs Reviewed  URINALYSIS, ROUTINE W REFLEX MICROSCOPIC - Abnormal; Notable for the following:    APPearance CLOUDY (*)    Hgb urine dipstick TRACE (*)    Ketones, ur 15 (*)    Leukocytes, UA SMALL (*)    All other components within normal limits  CBC WITH DIFFERENTIAL - Abnormal; Notable for the following:    Hemoglobin 10.5 (*)    HCT 32.5 (*)    MCV 77.6 (*)    MCH 25.1 (*)    All other components within normal limits  BASIC METABOLIC PANEL - Abnormal; Notable for the  following:    Potassium 3.4 (*)    Glucose, Bld 109 (*)    All other components within normal limits  URINE MICROSCOPIC-ADD ON - Abnormal; Notable for the following:    Squamous Epithelial / LPF MANY (*)    Bacteria, UA FEW (*)    All other components within normal limits  PREGNANCY, URINE   No results found.  1. Gastroenteritis     MDM  9:48 AM Labs pending. Patient given lomotil for diarrhea. Patient afebrile with stable vitals.   11:03 AM Labs and urinalysis unremarkable. Patient will be discharged with symptomatic treatment. Patient likely has a viral illness. Vitals stable and patient afebrile. Patient instructed to return with worsening or concerning symptoms.   Emilia Beck, [place] 04/16/13 1111

## 2015-11-14 ENCOUNTER — Encounter (HOSPITAL_COMMUNITY): Payer: Self-pay | Admitting: Emergency Medicine

## 2015-11-14 ENCOUNTER — Emergency Department (HOSPITAL_COMMUNITY): Payer: Medicaid Other

## 2015-11-14 ENCOUNTER — Emergency Department (HOSPITAL_COMMUNITY)
Admission: EM | Admit: 2015-11-14 | Discharge: 2015-11-14 | Discharge status: Against Medical Advice | Payer: Medicaid Other | Attending: Emergency Medicine | Admitting: Emergency Medicine

## 2015-11-14 ENCOUNTER — Emergency Department (HOSPITAL_COMMUNITY)
Admission: EM | Admit: 2015-11-14 | Discharge: 2015-11-14 | Disposition: A | Discharge status: Home / Self Care | Payer: Medicaid Other | Source: Home / Self Care | Attending: Emergency Medicine | Admitting: Emergency Medicine

## 2015-11-14 DIAGNOSIS — Y9389 Activity, other specified: Secondary | ICD-10-CM

## 2015-11-14 DIAGNOSIS — Z79899 Other long term (current) drug therapy: Secondary | ICD-10-CM | POA: Insufficient documentation

## 2015-11-14 DIAGNOSIS — Y998 Other external cause status: Secondary | ICD-10-CM | POA: Insufficient documentation

## 2015-11-14 DIAGNOSIS — Z3202 Encounter for pregnancy test, result negative: Secondary | ICD-10-CM | POA: Insufficient documentation

## 2015-11-14 DIAGNOSIS — Y9289 Other specified places as the place of occurrence of the external cause: Secondary | ICD-10-CM | POA: Insufficient documentation

## 2015-11-14 DIAGNOSIS — Z23 Encounter for immunization: Secondary | ICD-10-CM | POA: Insufficient documentation

## 2015-11-14 DIAGNOSIS — S01312A Laceration without foreign body of left ear, initial encounter: Secondary | ICD-10-CM

## 2015-11-14 DIAGNOSIS — F319 Bipolar disorder, unspecified: Secondary | ICD-10-CM | POA: Diagnosis not present

## 2015-11-14 LAB — POC URINE PREG, ED: PREG TEST UR: NEGATIVE

## 2015-11-14 MED ORDER — LIDOCAINE-EPINEPHRINE (PF) 2 %-1:200000 IJ SOLN: 20.0000 mL | Freq: Once | INTRAMUSCULAR | Status: DC

## 2015-11-14 MED ORDER — MORPHINE SULFATE (PF) 4 MG/ML IV SOLN
4.0000 mg | Freq: Once | INTRAVENOUS | Status: AC
  Administered 2015-11-14: 4 mg via INTRAMUSCULAR

## 2015-11-14 MED ORDER — CLINDAMYCIN HCL 300 MG PO CAPS
300.0000 mg | ORAL_CAPSULE | Freq: Once | ORAL | Status: AC
  Administered 2015-11-14: 300 mg via ORAL
  Filled 2015-11-14: qty 1

## 2015-11-14 MED ORDER — CLINDAMYCIN HCL 300 MG PO CAPS: 300.0000 mg | ORAL_CAPSULE | Freq: Four times a day (QID) | ORAL | Status: DC

## 2015-11-14 MED ORDER — LIDOCAINE HCL 2 % IJ SOLN
10.0000 mL | Freq: Once | INTRAMUSCULAR | Status: AC
  Administered 2015-11-14: 200 mg via INTRADERMAL
  Filled 2015-11-14: qty 20

## 2015-11-14 MED ORDER — LIDOCAINE-EPINEPHRINE-TETRACAINE (LET) SOLUTION
6.0000 mL | Freq: Once | NASAL | Status: DC
  Filled 2015-11-14: qty 6

## 2015-11-14 MED ORDER — LIDOCAINE-EPINEPHRINE 1 %-1:100000 IJ SOLN
20.0000 mL | Freq: Once | INTRAMUSCULAR | Status: AC
  Administered 2015-11-14: 20 mL
  Filled 2015-11-14: qty 1

## 2015-11-14 MED ORDER — HYDROCODONE-ACETAMINOPHEN 5-325 MG PO TABS: 1.0000 | ORAL_TABLET | Freq: Four times a day (QID) | ORAL | Status: DC | PRN

## 2015-11-14 MED ORDER — OXYCODONE-ACETAMINOPHEN 5-325 MG PO TABS
2.0000 | ORAL_TABLET | Freq: Once | ORAL | Status: AC
  Administered 2015-11-14: 2 via ORAL
  Filled 2015-11-14: qty 2

## 2015-11-14 MED ORDER — MORPHINE SULFATE (PF) 4 MG/ML IV SOLN
4.0000 mg | Freq: Once | INTRAVENOUS | Status: DC
  Filled 2015-11-14: qty 1

## 2015-11-14 MED ORDER — TETANUS-DIPHTH-ACELL PERTUSSIS 5-2.5-18.5 LF-MCG/0.5 IM SUSP
0.5000 mL | Freq: Once | INTRAMUSCULAR | Status: AC
  Administered 2015-11-14: 0.5 mL via INTRAMUSCULAR
  Filled 2015-11-14: qty 0.5

## 2015-11-14 NOTE — ED Notes (Signed)
Asked patient to put on gown she said"No I leave my clothes on"
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
## 2015-11-14 NOTE — ED Notes (Signed)
Pt refused to change into a gown

## 2015-11-14 NOTE — Discharge Instructions (Signed)
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
## 2015-11-14 NOTE — ED Notes (Signed)
Patient transported to CT 

## 2015-11-14 NOTE — ED Notes (Signed)
MD at bedside. 

## 2015-11-14 NOTE — ED Provider Notes (Signed)
Was asked to perform a nerve block on the ear by Danelle Berry, PA-C.   NERVE BLOCK Performed by: Anselm Pancoast Consent: Verbal consent obtained. Required items: required blood products, implants, devices, and special equipment available Time out: Immediately prior to procedure a "time out" was called to verify the correct patient, procedure, equipment, support staff and site/side marked as required.  Indication: Patient had a significant laceration to her left ear  Nerve block body site: Auriculo-temporal nerve  Preparation: Patient was prepped and draped in the usual sterile fashion. Needle gauge: 24 G Location technique: anatomical landmarks  Local anesthetic: 2% lidocaine   Anesthetic total: 2 ml  Outcome: pain improved Patient tolerance: Patient tolerated the procedure well with no immediate complications.   NERVE BLOCK Performed by: Anselm Pancoast Consent: Verbal consent obtained. Required items: required blood products, implants, devices, and special equipment available Time out: Immediately prior to procedure a "time out" was called to verify the correct patient, procedure, equipment, support staff and site/side marked as required.  Indication: Patient had a significant laceration to her left ear Nerve block body site: Lesser Occipital Nerve  Preparation: Patient was prepped and draped in the usual sterile fashion. Needle gauge: 24 G Location technique: anatomical landmarks  Local anesthetic: 2% lidocaine   Anesthetic total: 2 ml  Outcome: pain improved Patient tolerance: Patient tolerated the procedure well with no immediate complications.  NERVE BLOCK Performed by: Anselm Pancoast Consent: Verbal consent obtained. Required items: required blood products, implants, devices, and special equipment available Time out: Immediately prior to procedure a "time out" was called to verify the correct patient, procedure, equipment, support staff and site/side marked as  required.  Indication: Patient had a significant laceration to her left ear Nerve block body site: Greater Auricular Nerve  Preparation: Patient was prepped and draped in the usual sterile fashion. Needle gauge: 24 G Location technique: anatomical landmarks  Local anesthetic: 2% lidocaine   Anesthetic total: 2 ml  Outcome: pain improved Patient tolerance: Patient tolerated the procedure well with no immediate complications.    Anselm Pancoast, PA-C 11/14/15 1915  Lavera Guise, MD 11/15/15 [phone]
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
## 2015-11-14 NOTE — Discharge Instructions (Signed)
Keep ice on LEFT ear x 24 hrs Keep head elevated 3-4 nights OK to shower later today or tomorrow Clean the wound twice daily with Q tip and water, then apply a thin coat of antibiotic ointment Recheck 6 days for suture removal  [phone] for an appointment
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
## 2015-11-23 DIAGNOSIS — S01312A Laceration without foreign body of left ear, initial encounter: Secondary | ICD-10-CM | POA: Insufficient documentation

## 2017-08-22 ENCOUNTER — Other Ambulatory Visit: Payer: Self-pay

## 2017-08-22 ENCOUNTER — Emergency Department (HOSPITAL_COMMUNITY): Admission: EM | Admit: 2017-08-22 | Discharge: 2017-08-22 | Discharge status: Against Medical Advice | Payer: Self-pay

## 2017-08-22 ENCOUNTER — Encounter (HOSPITAL_COMMUNITY): Payer: Self-pay | Admitting: Student

## 2017-08-22 NOTE — ED Notes (Signed)
Called pt name for triage, no response. Notified Bonnie(RN)

## 2017-08-22 NOTE — ED Notes (Signed)
Attempted to call patient in waiting room - not in triage room. No response.

## 2018-08-06 ENCOUNTER — Encounter (HOSPITAL_COMMUNITY): Payer: Self-pay | Admitting: *Deleted

## 2018-08-06 ENCOUNTER — Other Ambulatory Visit: Payer: Self-pay

## 2018-08-06 ENCOUNTER — Emergency Department (HOSPITAL_COMMUNITY)
Admission: EM | Admit: 2018-08-06 | Discharge: 2018-08-06 | Disposition: A | Discharge status: Home / Self Care | Payer: Medicaid Other | Attending: Emergency Medicine | Admitting: Emergency Medicine

## 2018-08-06 DIAGNOSIS — Z046 Encounter for general psychiatric examination, requested by authority: Secondary | ICD-10-CM | POA: Insufficient documentation

## 2018-08-06 DIAGNOSIS — J45909 Unspecified asthma, uncomplicated: Secondary | ICD-10-CM | POA: Insufficient documentation

## 2018-08-06 DIAGNOSIS — R109 Unspecified abdominal pain: Secondary | ICD-10-CM | POA: Insufficient documentation

## 2018-08-06 DIAGNOSIS — R45851 Suicidal ideations: Secondary | ICD-10-CM | POA: Insufficient documentation

## 2018-08-06 DIAGNOSIS — F329 Major depressive disorder, single episode, unspecified: Secondary | ICD-10-CM

## 2018-08-06 DIAGNOSIS — F330 Major depressive disorder, recurrent, mild: Secondary | ICD-10-CM | POA: Diagnosis not present

## 2018-08-06 DIAGNOSIS — Z79899 Other long term (current) drug therapy: Secondary | ICD-10-CM | POA: Diagnosis not present

## 2018-08-06 DIAGNOSIS — F17200 Nicotine dependence, unspecified, uncomplicated: Secondary | ICD-10-CM | POA: Diagnosis not present

## 2018-08-06 DIAGNOSIS — R1110 Vomiting, unspecified: Secondary | ICD-10-CM | POA: Insufficient documentation

## 2018-08-06 DIAGNOSIS — F32A Depression, unspecified: Secondary | ICD-10-CM

## 2018-08-06 HISTORY — DX: Major depressive disorder, single episode, unspecified: F32.9

## 2018-08-06 HISTORY — DX: Anxiety disorder, unspecified: F41.9

## 2018-08-06 HISTORY — DX: Unspecified asthma, uncomplicated: J45.909

## 2018-08-06 HISTORY — DX: Bipolar disorder, unspecified: F31.9

## 2018-08-06 HISTORY — DX: Depression, unspecified: F32.A

## 2018-08-06 LAB — COMPREHENSIVE METABOLIC PANEL
ALT: 12 U/L (ref 0–44)
ANION GAP: 11 (ref 5–15)
AST: 16 U/L (ref 15–41)
Albumin: 4.2 g/dL (ref 3.5–5.0)
Alkaline Phosphatase: 37 U/L — ABNORMAL LOW (ref 38–126)
BUN: 8 mg/dL (ref 6–20)
CHLORIDE: 107 mmol/L (ref 98–111)
CO2: 24 mmol/L (ref 22–32)
Calcium: 9.3 mg/dL (ref 8.9–10.3)
Creatinine, Ser: 0.63 mg/dL (ref 0.44–1.00)
GFR calc Af Amer: >60 mL/min (ref >60)
GFR calc non Af Amer: >60 mL/min (ref >60)
Glucose, Bld: 102 mg/dL — ABNORMAL HIGH (ref 70–99)
POTASSIUM: 3.7 mmol/L (ref 3.5–5.1)
Sodium: 142 mmol/L (ref 135–145)
Total Bilirubin: 0.6 mg/dL (ref 0.3–1.2)
Total Protein: 7.3 g/dL (ref 6.5–8.1)

## 2018-08-06 LAB — CBC
HCT: 38.5 % (ref 36.0–46.0)
HEMOGLOBIN: 12.2 g/dL (ref 12.0–15.0)
MCH: 24.7 pg — AB (ref 26.0–34.0)
MCHC: 31.7 g/dL (ref 30.0–36.0)
MCV: 77.9 fL — AB (ref 80.0–100.0)
NRBC: 0 % (ref 0.0–0.2)
Platelets: 372 10*3/uL (ref 150–400)
RBC: 4.94 MIL/uL (ref 3.87–5.11)
RDW: 13.9 % (ref 11.5–15.5)
WBC: 9.3 10*3/uL (ref 4.0–10.5)

## 2018-08-06 LAB — ACETAMINOPHEN LEVEL

## 2018-08-06 LAB — RAPID URINE DRUG SCREEN, HOSP PERFORMED
AMPHETAMINES: NOT DETECTED
BENZODIAZEPINES: NOT DETECTED
Barbiturates: NOT DETECTED
Cocaine: NOT DETECTED
Opiates: NOT DETECTED
TETRAHYDROCANNABINOL: POSITIVE — AB

## 2018-08-06 LAB — SALICYLATE LEVEL

## 2018-08-06 LAB — I-STAT BETA HCG BLOOD, ED (MC, WL, AP ONLY)

## 2018-08-06 LAB — ETHANOL: Alcohol, Ethyl (B): 96 mg/dL — ABNORMAL HIGH (ref <10)

## 2018-08-06 MED ORDER — ONDANSETRON HCL 4 MG PO TABS: 4.0000 mg | ORAL_TABLET | Freq: Three times a day (TID) | ORAL | Status: DC | PRN

## 2018-08-06 MED ORDER — MIRTAZAPINE 7.5 MG PO TABS: 7.5000 mg | ORAL_TABLET | Freq: Every day | ORAL | 0 refills | Status: DC

## 2018-08-06 MED ORDER — GABAPENTIN 300 MG PO CAPS
300.0000 mg | ORAL_CAPSULE | Freq: Three times a day (TID) | ORAL | Status: DC
  Administered 2018-08-06: 300 mg via ORAL
  Filled 2018-08-06: qty 1

## 2018-08-06 MED ORDER — ARIPIPRAZOLE 5 MG PO TABS
5.0000 mg | ORAL_TABLET | Freq: Every day | ORAL | Status: DC
  Filled 2018-08-06: qty 1

## 2018-08-06 MED ORDER — ARIPIPRAZOLE 10 MG PO TABS: 10.0000 mg | ORAL_TABLET | Freq: Every day | ORAL | Status: DC

## 2018-08-06 MED ORDER — ARIPIPRAZOLE 5 MG PO TABS: 5.0000 mg | ORAL_TABLET | Freq: Every day | ORAL | 0 refills | Status: DC

## 2018-08-06 MED ORDER — ESCITALOPRAM OXALATE 10 MG PO TABS
10.0000 mg | ORAL_TABLET | Freq: Every day | ORAL | Status: DC
  Administered 2018-08-06: 10 mg via ORAL
  Filled 2018-08-06: qty 1

## 2018-08-06 MED ORDER — METOCLOPRAMIDE HCL 5 MG/ML IJ SOLN
10.0000 mg | Freq: Once | INTRAMUSCULAR | Status: AC
  Administered 2018-08-06: 10 mg via INTRAMUSCULAR
  Filled 2018-08-06 (×2): qty 2

## 2018-08-06 MED ORDER — NICOTINE 21 MG/24HR TD PT24: 21.0000 mg | MEDICATED_PATCH | Freq: Every day | TRANSDERMAL | Status: DC

## 2018-08-06 MED ORDER — MIRTAZAPINE 30 MG PO TABS: 15.0000 mg | ORAL_TABLET | Freq: Every day | ORAL | Status: DC

## 2018-08-06 MED ORDER — ACETAMINOPHEN 325 MG PO TABS: 650.0000 mg | ORAL_TABLET | ORAL | Status: DC | PRN

## 2018-08-06 MED ORDER — ALUM & MAG HYDROXIDE-SIMETH 200-200-20 MG/5ML PO SUSP: 30.0000 mL | Freq: Four times a day (QID) | ORAL | Status: DC | PRN

## 2018-08-06 MED ORDER — MIRTAZAPINE 7.5 MG PO TABS: 7.5000 mg | ORAL_TABLET | Freq: Every day | ORAL | Status: DC

## 2018-08-06 MED ORDER — ESCITALOPRAM OXALATE 10 MG PO TABS: 10.0000 mg | ORAL_TABLET | Freq: Every day | ORAL | 0 refills | Status: DC
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
## 2018-08-06 NOTE — [facility] Suicide Risk Assessment (Signed)
Suicide Risk Assessment  Discharge Assessment   [facility] Discharge Suicide Risk Assessment   Principal Problem: Major depressive disorder, recurrent, mild (HCC) Discharge Diagnoses:  Patient Active Problem List   Diagnosis Date Noted  . 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 rate 16, height 5\' 6"  (1.676 m), weight 46.3 kg, last menstrual period 08/04/2018, SpO2 100 %.Body mass index is 16.46 kg/m.  General Appearance: Casual  Eye Contact::  Good  Speech:  Normal Rate409  Volume:  Normal  Mood:  Depressed, mild  Affect:  Congruent  Thought Process:  Coherent and Descriptions of Associations: Intact  Orientation:  Full (Time, Place, and Person)  Thought Content:  WDL and Logical  Suicidal Thoughts:  No  Homicidal Thoughts:  No  Memory:  Immediate;   Good Recent;   Good Remote;   Good  Judgement:  Fair  Insight:  Fair  Psychomotor Activity:  Normal  Concentration:  Good  Recall:  Good  Fund of Knowledge:Good  Language: Good  Akathisia:  No  Handed:  Right  AIMS (if indicated):     Assets:  Leisure Time Physical Health Resilience Social Support  Sleep:     Cognition: WNL  ADL's:  Intact   Mental Status Per Nursing Assessment::   On Admission:   42 yo female who wanted to get on her medications and did not like how sleepy they made her.  Discussed medications and decreased her Abilify, denies suicidal/homicidal ideations, hallucinations, and withdrawal symptoms.  She feels better after eating as she drank last night.  STable for discharge.  Demographic Factors:  NA  Loss Factors: NA  Historical Factors: NA  Risk Reduction Factors:   Sense of  responsibility to family, Living with another person, especially a relative and Positive social support  Continued Clinical Symptoms:  Depression, mild  Cognitive Features That Contribute To Risk:  None    Suicide Risk:  Minimal: No identifiable suicidal ideation.  Patients presenting with no risk factors but with morbid ruminations; may be classified as minimal risk based on the severity of the depressive symptoms    Plan Of Care/Follow-up recommendations:  Activity:  as tolerated Diet:  heart healhty diet  Nanine Means, NP 08/06/2018, 12:37 PM

## 2018-08-06 NOTE — BH Assessment (Signed)
Assessment Note  Erika Garcia is an 42 y.o. female that presents this date with S/I. Patient reports current intent although denies any plan. Patient denies any previous intent or gestures at self harm. Patient denies any H/I or AVH. Patient reports she was diagnosed with depression over ten years ago and receives current services to include medication management from [facility]. Patient reports current compliance although feels her depression has worsened since she has been having relationship issues in the last month with symptoms to include: excessive sleeping (over 12 hours nightly) and feeling worthless. Patient reports she has a appointment on 08/18/18 for medication management stating she "doesn't feel her medications are working." Patient's SA history to include daily Cannabis use for the last year (1 gram or more) with last use earlier this date when patient reported using 1 gram. Patient's UDS is positive for THC this date. Patient denies any other SA issues. Patient is oriented x 4 and is observed to be drowsy speaking in a low soft voice. Patient is a poor historian and renders limited history. Per notes, "Patient presents with worsening depression and suicidal thoughts. This is been a long-term thing but most recently worsened in the last 3 months. She had married her partner and thought that would change everything but it is only made things worse in addition to starting new mental health medication 3 months ago which also she states is just making her more depressed. She is complaining of sleeping all the time, no energy, anhedonia, decreased appetite and weight loss". Patient is requesting to be evaluated for medication management to assist with symptoms. Case was staffed with Sharma Covert DO who recommended patient be observed and monitored for safety. Patient will also be evaluated for possible medication interventions.     Diagnosis: F33.2 MDD recurrent without psychotic symptoms, severe  Cannabis use   Past Medical History:  Past Medical History:  Diagnosis Date  . Anxiety   . Asthma   . Bipolar 1 disorder (HCC)   . Depression   . Seizure (HCC)   . Seizures (HCC)     Past Surgical History:  Procedure Laterality Date  . TUBAL LIGATION      Family History: No family history on file.  Social History:  reports that she has been smoking. She has never used smokeless tobacco. She reports that she drinks alcohol. She reports that she has current or past drug history. Drug: Marijuana. Frequency: 1.00 time per week.  Additional Social History:  Alcohol / Drug Use Pain Medications: See MAR Prescriptions: See MAR Over the Counter: See MAR History of alcohol / drug use?: Yes Longest period of sobriety (when/how long): Unknown Negative Consequences of Use: (Denies) Withdrawal Symptoms: (Denies) Substance #1 Name of Substance 1: Cannabis  1 - Age of First Use: Pt states "young"  1 - Amount (size/oz): Pt reports different amounts  1 - Frequency: Daily  1 - Duration: Pt states unknown 1 - Last Use / Amount: 08/06/18 pt states unknown amount  CIWA: CIWA-Ar BP: (!) 116/91 COWS:    Allergies:  Allergies  Allergen Reactions  . Fluoxetine Other (See Comments)    hives    Home Medications:  (Not in a hospital admission)  OB/GYN Status:  Patient's last menstrual period was 08/04/2018.  General Assessment Data [facility] of [facility]: [facility] TTS Assessment: In system Is this a Tele or Face-to-Face Assessment?: Face-to-Face Is this an Initial Assessment or a Re-assessment for this encounter?: Initial Assessment Patient Accompanied by:: (NA) Language Other  than English: No Living Arrangements: Other (Comment)(Partner) What gender do you identify as?: Female Marital status: Married [place] name: Maldonado Pregnancy Status: No Living Arrangements: Spouse/significant other Can pt return to current living arrangement?: Yes Admission Status: Voluntary Is patient  capable of signing voluntary admission?: Yes Referral Source: Self/Family/Friend Insurance type: Medicaid  Medical Screening Exam [facility] Walk-in ONLY) Medical Exam completed: Yes  Crisis Care Plan Living Arrangements: Spouse/significant other Legal Guardian: (NA) Name of Psychiatrist: [facility] Name of Therapist: None  Education Status Is patient currently in school?: No Is the patient employed, unemployed or receiving disability?: Unemployed  Risk to self with the past 6 months Suicidal Ideation: Yes-Currently Present Has patient been a risk to self within the past 6 months prior to admission? : No Suicidal Intent: Yes-Currently Present Has patient had any suicidal intent within the past 6 months prior to admission? : No Is patient at risk for suicide?: Yes Suicidal Plan?: No Has patient had any suicidal plan within the past 6 months prior to admission? : No Access to Means: No What has been your use of drugs/alcohol within the last 12 months?: Current use Previous Attempts/Gestures: No How many times?: 0 Other Self Harm Risks: (Relationship issues) Triggers for Past Attempts: Unknown Intentional Self Injurious Behavior: None Family Suicide History: No Recent stressful life event(s): Conflict (Comment)(Relationship issues) Persecutory voices/beliefs?: No Depression: Yes Depression Symptoms: Feeling angry/irritable Substance abuse history and/or treatment for substance abuse?: No Suicide prevention information given to non-admitted patients: Not applicable  Risk to Others within the past 6 months Homicidal Ideation: No Does patient have any lifetime risk of violence toward others beyond the six months prior to admission? : No Thoughts of Harm to Others: No Current Homicidal Intent: No Current Homicidal Plan: No Access to Homicidal Means: No Identified Victim: NA History of harm to others?: No Assessment of Violence: None Noted Violent Behavior Description: NA Does  patient have access to weapons?: No Criminal Charges Pending?: No Does patient have a court date: No Is patient on probation?: No  Psychosis Hallucinations: None noted Delusions: None noted  Mental Status Report Appearance/Hygiene: In scrubs Eye Contact: Fair Motor Activity: Unremarkable Speech: Soft, Slow Level of Consciousness: Drowsy Mood: Depressed Affect: Appropriate to circumstance Anxiety Level: Minimal Thought Processes: Thought Blocking Judgement: Partial Orientation: Person, Place, Time Obsessive Compulsive Thoughts/Behaviors: None  Cognitive Functioning Concentration: Fair Memory: Recent Impaired Is patient IDD: No Insight: Fair Impulse Control: Fair Appetite: Good Have you had any weight changes? : No Change Sleep: Increased Total Hours of Sleep: 12 Vegetative Symptoms: None  ADLScreening [facility] Assessment Services) Patient's cognitive ability adequate to safely complete daily activities?: Yes Patient able to express need for assistance with ADLs?: Yes Independently performs ADLs?: Yes (appropriate for developmental age)  Prior Inpatient Therapy Prior Inpatient Therapy: No  Prior Outpatient Therapy Prior Outpatient Therapy: Yes Prior Therapy Dates: Ongoing Prior Therapy Facilty/Provider(s): [facility] Reason for Treatment: Med mang Does patient have an ACCT team?: No Does patient have Intensive In-House Services?  : No Does patient have [facility] services? : Yes Does patient have P4CC services?: No  ADL Screening (condition at time of admission) Patient's cognitive ability adequate to safely complete daily activities?: Yes Is the patient deaf or have difficulty hearing?: No Does the patient have difficulty seeing, even when wearing glasses/contacts?: No Does the patient have difficulty concentrating, remembering, or making decisions?: No Patient able to express need for assistance with ADLs?: Yes Does the patient have difficulty dressing or bathing?:  No Independently performs ADLs?:  Yes (appropriate for developmental age) Does the patient have difficulty walking or climbing stairs?: No Weakness of Legs: None Weakness of Arms/Hands: None  Home Assistive Devices/Equipment Home Assistive Devices/Equipment: None  Therapy Consults (therapy consults require a physician order) PT Evaluation Needed: No OT Evalulation Needed: No SLP Evaluation Needed: No Abuse/Neglect Assessment (Assessment to be complete while patient is alone) Physical Abuse: Denies Verbal Abuse: Denies Sexual Abuse: Denies Exploitation of patient/patient's resources: Denies Self-Neglect: Denies Values / Beliefs Cultural Requests During Hospitalization: None Spiritual Requests During Hospitalization: None Consults Spiritual Care Consult Needed: No Social Work Consult Needed: No Merchant navy officer (For Healthcare) Does Patient Have a Medical Advance Directive?: No Would patient like information on creating a medical advance directive?: No - Patient declined          Disposition: Case was staffed with Sharma Covert DO who recommended patient be observed and monitored for safety. Patient will also be evaluated for possible medication interventions.     Disposition Initial Assessment Completed for this Encounter: Yes Disposition of Patient: (Observe and monitor) Patient refused recommended treatment: No Mode of transportation if patient is discharged?: (Unk)  On Site Evaluation by:   Reviewed with Physician:    Alfredia Ferguson 08/06/2018 11:09 AM

## 2018-08-06 NOTE — ED Notes (Signed)
Dr. Anitra Lauth notified due to patient vomiting in room.  Patient reports emesis is yellow in color.  Patient reports she has had many episodes of vomiting in the past two months and pain in her abdominal area intermittently.

## 2018-08-06 NOTE — ED Notes (Signed)
Bed: [facility] Expected date:  Expected time:  Means of arrival:  Comments: 

## 2018-08-06 NOTE — BH Assessment (Addendum)
[facility] Assessment Progress Note  Per Juanetta Beets, DO, this voluntary pt is to be held at [facility] overnight for further observation and stabilization.  Pt's nurse, Aram Beecham, has been notified.  Doylene Canning, [place] Behavioral Health Coordinator [phone]   Addendum:  Pt is asking to be discharged, to which Nanine Means, DNP agrees.  Pt is to be discharged from [facility] with recommendation to continue treatment with [facility].  Pt's nurse, Angelique Blonder, has been notified.  Doylene Canning, [place] Behavioral Health Coordinator [phone]

## 2018-08-06 NOTE — ED Provider Notes (Signed)
[facility]-EMERGENCY DEPT Provider Note   CSN: [id number] Arrival date & time: 08/06/18  4098     History   Chief Complaint Chief Complaint  Patient presents with  . Suicidal    HPI Erika Garcia is a 42 y.o. female.  The history is provided by the patient.  Mental Health Problem  Presenting symptoms: depression and suicidal thoughts   Presenting symptoms: no aggressive behavior and no hallucinations   Patient accompanied by: no one. Degree of incapacity (severity):  Severe Onset quality:  Gradual Timing:  Constant Progression:  Worsening (worsening over the last 3 months since she started new medication) Chronicity:  Recurrent Context: drug abuse, medication and stressful life event   Context: not alcohol use   Context comment:  Patient does use marijuana regularly but no other drug use.  She was started on depression medication 3 months ago at [facility] [address] but states since that time her symptoms have worsened Treatment compliance:  All of the time Time since last psychoactive medication taken:  1 day Relieved by:  Nothing Worsened by:  Family interactions Ineffective treatments:  Antidepressants and mood stabilizers Associated symptoms: anhedonia, appetite change, fatigue, feelings of worthlessness, hypersomnia and weight change   Associated symptoms: no abdominal pain, no anxiety, no chest pain and no euphoric mood   Risk factors: hx of mental illness     Past Medical History:  Diagnosis Date  . Anxiety   . Asthma   . Bipolar 1 disorder (HCC)   . Depression   . Seizure (HCC)   . Seizures (HCC)     There are no active problems to display for this patient.   Past Surgical History:  Procedure Laterality Date  . TUBAL LIGATION       OB History   None      Home Medications    Prior to Admission medications   Medication Sig Start Date End Date Taking? Authorizing Provider  acetaminophen (TYLENOL) 325 MG tablet Take 650 mg  by mouth every 6 (six) hours as needed for mild pain.    [provider]  divalproex (DEPAKOTE ER) 500 MG 24 hr tablet Take 500 mg by mouth daily.    [provider]  HYDROcodone-acetaminophen (NORCO/VICODIN) 5-325 MG tablet Take 1-2 tablets by mouth every 6 (six) hours as needed. 11/14/15   Danelle Berry, PA-C  mirtazapine (REMERON) 45 MG tablet Take 1 tablet (45 mg total) by mouth at bedtime. 12/04/12   Muthersbaugh, Dahlia Client, PA-C  SEROQUEL XR 150 MG 24 hr tablet Take 150 mg by mouth at bedtime. 11/06/15   [provider]    Family History No family history on file.  Social History Social History   Tobacco Use  . Smoking status: Current Every Day Smoker  . Smokeless tobacco: Never Used  Substance Use Topics  . Alcohol use: Yes    Comment: rarely  . Drug use: Yes    Frequency: 1.0 times per week    Types: Marijuana     Allergies   Patient has no known allergies.   Review of Systems Review of Systems  Constitutional: Positive for appetite change and fatigue.  Cardiovascular: Negative for chest pain.  Gastrointestinal: Negative for abdominal pain.  Psychiatric/Behavioral: Positive for suicidal ideas. Negative for hallucinations. The patient is not nervous/anxious.   All other systems reviewed and are negative.    Physical Exam Updated Vital Signs BP (!) 116/91 (BP Location: Left Arm)   Temp 98.5 F (36.9 C) (Oral)  Resp 16   Ht 5\' 6"  (1.676 m)   Wt 46.3 kg   LMP 08/04/2018   SpO2 100%   BMI 16.46 kg/m   Physical Exam  Constitutional: She is oriented to person, place, and time. She appears well-developed and well-nourished. No distress.  HENT:  Head: Normocephalic and atraumatic.  Mouth/Throat: Oropharynx is clear and moist.  Eyes: Pupils are equal, round, and reactive to light. Conjunctivae and EOM are normal.  Neck: Normal range of motion. Neck supple.  Cardiovascular: Normal rate, regular rhythm and intact distal pulses.  No murmur  heard. Pulmonary/Chest: Effort normal and breath sounds normal. No respiratory distress. She has no wheezes. She has no rales.  Abdominal: Soft. She exhibits no distension. There is no tenderness. There is no rebound and no guarding.  Musculoskeletal: Normal range of motion. She exhibits no edema or tenderness.  Neurological: She is alert and oriented to person, place, and time.  Skin: Skin is warm and dry. No rash noted. No erythema.  Psychiatric: Her affect is blunt. Her speech is delayed. She is slowed. She is not actively hallucinating. Thought content is not paranoid. Cognition and memory are normal. She exhibits a depressed mood. She expresses suicidal ideation.  Poor eye contact and tearful  Nursing note and vitals reviewed.    ED Treatments / Results  Labs (all labs ordered are listed, but only abnormal results are displayed) Labs Reviewed  COMPREHENSIVE METABOLIC PANEL - Abnormal; Notable for the following components:      Result Value   Glucose, Bld 102 (*)    Alkaline Phosphatase 37 (*)    All other components within normal limits  ETHANOL - Abnormal; Notable for the following components:   Alcohol, Ethyl (B) 96 (*)    All other components within normal limits  ACETAMINOPHEN LEVEL - Abnormal; Notable for the following components:   Acetaminophen (Tylenol), Serum <10 (*)    All other components within normal limits  CBC - Abnormal; Notable for the following components:   MCV 77.9 (*)    MCH 24.7 (*)    All other components within normal limits  RAPID URINE DRUG SCREEN, HOSP PERFORMED - Abnormal; Notable for the following components:   Tetrahydrocannabinol POSITIVE (*)    All other components within normal limits  SALICYLATE LEVEL  I-STAT BETA HCG BLOOD, ED ([facility], [facility], [facility] ONLY)    EKG None  Radiology No results found.  Procedures Procedures (including critical care time)  Medications Ordered in ED Medications - No data to display   Initial Impression /  Assessment and Plan / ED Course  I have reviewed the triage vital signs and the nursing notes.  Pertinent labs & imaging results that were available during my care of the patient were reviewed by me and considered in my medical decision making (see chart for details).     42 year old female presenting with worsening depression and suicidal thoughts.  This is been a long-term thing but most recently worsened in the last 3 months.  She had married her partner and thought that would change everything but it is only made things worse in addition to starting new mental health meds 3 months ago which also she states is just making her more depressed.  She is complaining of sleeping all the time, no energy, anhedonia, decreased appetite and weight loss.  Patient's vital signs are within normal limits and she appears medically clear.  Labs are pending and TTS consulted. 8:02 AM Labs unremarkable except alcohol of 96.  Pt cleared.   Final Clinical Impressions(s) / ED Diagnoses   Final diagnoses:  Suicidal ideation  Depression, unspecified depression type    ED Discharge Orders    None       Gwyneth Sprout, MD 08/06/18 [phone]

## 2018-08-06 NOTE — Discharge Instructions (Signed)
For your mental health needs, you are advised to continue treatment with [facility]: ° °     [facility] °     [address] °     [place], [place] [address] °     [phone]

## 2018-08-06 NOTE — BH Assessment (Signed)
[facility] Assessment Progress Note Case was staffed with Sharma Covert DO who recommended patient be observed and monitored for safety. Patient will also be evaluated for possible medication interventions.

## 2018-08-06 NOTE — ED Notes (Signed)
Patient reports she has had bipolar depression since she was a teen.  She gets her medications from [facility] and currently takes abilify, remeron, and lexapro.  She reports these medications are not helping and she has felt suicidal for several months.  She reports decreased appetite, not sleeping well, and having arguments with her significant other.

## 2018-08-06 NOTE — ED Notes (Signed)
Educated on discharge instructions and prescriptions. Administered meds as ordered.

## 2018-08-06 NOTE — ED Notes (Signed)
Bed: [facility] Expected date:  Expected time:  Means of arrival:  Comments: 

## 2018-08-16 ENCOUNTER — Encounter (HOSPITAL_COMMUNITY): Payer: Self-pay | Admitting: *Deleted

## 2018-08-16 ENCOUNTER — Emergency Department (HOSPITAL_COMMUNITY): Payer: Medicaid Other

## 2018-08-16 ENCOUNTER — Emergency Department (HOSPITAL_COMMUNITY)
Admission: EM | Admit: 2018-08-16 | Discharge: 2018-08-16 | Disposition: A | Discharge status: Home / Self Care | Payer: Medicaid Other | Attending: Emergency Medicine | Admitting: Emergency Medicine

## 2018-08-16 ENCOUNTER — Other Ambulatory Visit: Payer: Self-pay

## 2018-08-16 DIAGNOSIS — F17210 Nicotine dependence, cigarettes, uncomplicated: Secondary | ICD-10-CM | POA: Diagnosis not present

## 2018-08-16 DIAGNOSIS — J45909 Unspecified asthma, uncomplicated: Secondary | ICD-10-CM | POA: Insufficient documentation

## 2018-08-16 DIAGNOSIS — Y929 Unspecified place or not applicable: Secondary | ICD-10-CM | POA: Diagnosis not present

## 2018-08-16 DIAGNOSIS — Y9389 Activity, other specified: Secondary | ICD-10-CM | POA: Insufficient documentation

## 2018-08-16 DIAGNOSIS — Y999 Unspecified external cause status: Secondary | ICD-10-CM | POA: Diagnosis not present

## 2018-08-16 DIAGNOSIS — Z79899 Other long term (current) drug therapy: Secondary | ICD-10-CM | POA: Diagnosis not present

## 2018-08-16 DIAGNOSIS — Z23 Encounter for immunization: Secondary | ICD-10-CM | POA: Insufficient documentation

## 2018-08-16 DIAGNOSIS — S025XXA Fracture of tooth (traumatic), initial encounter for closed fracture: Secondary | ICD-10-CM | POA: Insufficient documentation

## 2018-08-16 DIAGNOSIS — S022XXA Fracture of nasal bones, initial encounter for closed fracture: Secondary | ICD-10-CM | POA: Insufficient documentation

## 2018-08-16 DIAGNOSIS — S02401A Maxillary fracture, unspecified, initial encounter for closed fracture: Secondary | ICD-10-CM

## 2018-08-16 DIAGNOSIS — S0993XA Unspecified injury of face, initial encounter: Secondary | ICD-10-CM | POA: Diagnosis present

## 2018-08-16 MED ORDER — PENICILLIN V POTASSIUM 500 MG PO TABS: 500.0000 mg | ORAL_TABLET | Freq: Four times a day (QID) | ORAL | 0 refills | Status: AC

## 2018-08-16 MED ORDER — OXYCODONE-ACETAMINOPHEN 5-325 MG PO TABS: 1.0000 | ORAL_TABLET | Freq: Four times a day (QID) | ORAL | 0 refills | Status: DC | PRN

## 2018-08-16 MED ORDER — OXYCODONE-ACETAMINOPHEN 5-325 MG PO TABS
1.0000 | ORAL_TABLET | Freq: Once | ORAL | Status: AC
  Administered 2018-08-16: 1 via ORAL
  Filled 2018-08-16: qty 1

## 2018-08-16 MED ORDER — TETANUS-DIPHTH-ACELL PERTUSSIS 5-2.5-18.5 LF-MCG/0.5 IM SUSP
0.5000 mL | Freq: Once | INTRAMUSCULAR | Status: AC
  Administered 2018-08-16: 0.5 mL via INTRAMUSCULAR
  Filled 2018-08-16: qty 0.5
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
## 2018-08-16 NOTE — Discharge Instructions (Addendum)
Please read attached information. If you experience any new or worsening signs or symptoms please return to the emergency room for evaluation. Please follow-up with your primary care provider or specialist as discussed. Please use medication prescribed only as directed and discontinue taking if you have any concerning signs or symptoms.   °
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
## 2021-06-24 ENCOUNTER — Ambulatory Visit (HOSPITAL_COMMUNITY)
Admission: EM | Admit: 2021-06-24 | Discharge: 2021-06-24 | Disposition: A | Discharge status: Home / Self Care | Payer: No Payment, Other | Attending: Family | Admitting: Family

## 2021-06-24 ENCOUNTER — Other Ambulatory Visit: Payer: Self-pay

## 2021-06-24 DIAGNOSIS — Z733 Stress, not elsewhere classified: Secondary | ICD-10-CM | POA: Insufficient documentation

## 2021-06-24 DIAGNOSIS — F330 Major depressive disorder, recurrent, mild: Secondary | ICD-10-CM | POA: Insufficient documentation

## 2021-06-24 DIAGNOSIS — Z638 Other specified problems related to primary support group: Secondary | ICD-10-CM | POA: Insufficient documentation

## 2021-06-24 DIAGNOSIS — Z5989 Other problems related to housing and economic circumstances: Secondary | ICD-10-CM | POA: Insufficient documentation

## 2021-06-24 DIAGNOSIS — F419 Anxiety disorder, unspecified: Secondary | ICD-10-CM | POA: Insufficient documentation

## 2021-06-24 DIAGNOSIS — Z9114 Patient's other noncompliance with medication regimen: Secondary | ICD-10-CM | POA: Insufficient documentation

## 2021-06-24 DIAGNOSIS — Z760 Encounter for issue of repeat prescription: Secondary | ICD-10-CM | POA: Insufficient documentation

## 2021-06-24 MED ORDER — MIRTAZAPINE 7.5 MG PO TABS
7.5000 mg | ORAL_TABLET | Freq: Every day | ORAL | 0 refills | Status: DC
  Filled 2021-06-24: qty 30, 30d supply, fill #0
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
## 2021-06-24 NOTE — Discharge Instructions (Addendum)
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
## 2021-12-25 ENCOUNTER — Emergency Department (HOSPITAL_COMMUNITY)
Admission: EM | Admit: 2021-12-25 | Discharge: 2021-12-25 | Disposition: A | Discharge status: Home / Self Care | Payer: Medicaid Other | Attending: Emergency Medicine | Admitting: Emergency Medicine

## 2021-12-25 ENCOUNTER — Encounter (HOSPITAL_COMMUNITY): Payer: Self-pay

## 2021-12-25 DIAGNOSIS — Z20822 Contact with and (suspected) exposure to covid-19: Secondary | ICD-10-CM | POA: Insufficient documentation

## 2021-12-25 DIAGNOSIS — J069 Acute upper respiratory infection, unspecified: Secondary | ICD-10-CM | POA: Insufficient documentation

## 2021-12-25 LAB — RESP PANEL BY RT-PCR (FLU A&B, COVID) ARPGX2
Influenza A by PCR: NEGATIVE
Influenza B by PCR: NEGATIVE
SARS Coronavirus 2 by RT PCR: NEGATIVE

## 2021-12-25 MED ORDER — IBUPROFEN 400 MG PO TABS
600.0000 mg | ORAL_TABLET | Freq: Once | ORAL | Status: AC
  Administered 2021-12-25: 600 mg via ORAL
  Filled 2021-12-25: qty 1

## 2021-12-25 MED ORDER — BENZONATATE 100 MG PO CAPS: 100.0000 mg | ORAL_CAPSULE | Freq: Three times a day (TID) | ORAL | 0 refills | Status: DC

## 2021-12-25 NOTE — ED Triage Notes (Signed)
Pt arrives via [facility] for c/o URI symptoms including cough, fever, body aches, loss of taste.  ? ? ?

## 2021-12-25 NOTE — ED Notes (Signed)
Patient Alert and oriented to baseline. Stable and ambulatory to baseline. Patient verbalized understanding of the discharge instructions.  Patient belongings were taken by the patient.   

## 2021-12-25 NOTE — Discharge Instructions (Addendum)
?MATCH Medication Assistance Card ?Name:  Erika Garcia ?ID: [id number] ?Bin: [id number] RX Group: [id number] ?Discharge Date:  12/25/2021 ?Expiration Date:  01/01/2022 ?(must be filled within 7 days of discharge ?Dear __________[name]__________ ? ?You have been approved to have the prescriptions written by your discharging physician filled through our [facility] (Medication Assistance Through [facility]) program. This program allows for a one-time (no refills) 34-day supply of selected medications for a low copay amount. ? ?The copay is $3.00 per prescription. For instance, if you have one prescription, you will pay $3.00; for two prescriptions, you pay $6.00; for three prescriptions, you pay $9.00; and so on. ? ?Only certain pharmacies are participating in this program with [facility]. You will need to select one of the pharmacies from the attached list and take your prescriptions, this letter, and your photo ID to one of the participating pharmacies.  ? ?We are excited that you are able to use the [facility] program to get your medications. These prescriptions must be filled within 7 days of hospital discharge or they will no longer be valid for the [facility] program. Should you have any problems with your prescriptions please contact your case management team member at [phone] for [facility]. ? ?Thank you, [name] DNP, MSN, RN [phone] ? ? ?[place] ? ?  ?[place] - Locations ? ?[facility] ?[facility]        ?[address], [place], [place] ? ?[facility]    ?[address] [place], [place], [place]  ? ?[facility]        [address], [place], [place] ? ?[facility]       [address] [place], [place], [place] ? ?Other ?[facility] ?[address] [place], [place] ? ?[facility]                                                                  ?[address] [place], [place] ? ?[facility]          ?[address], [place], [place] ? ?[facility] ?[address], [place], [place]   ?[address], [place], [place]  ?[address] [place], [place], [place]  ?[address] [place], [place]   ?[address] [place], [place]   ?[address], [place], [place]   ?[address], [place], [place] ?[address], [place], [place] ?[address], [place], [place] ? ? ? ?[facility] ?[address] [place], [place], [place]  ?[address] [place], [place] ?[address], [place], [place]  ?[address] [place], [place] ?[address], [place], [place] ?[address], [place], [place] ?[address] [place], [place] ? ?[address], [place], [place] ?[address], [place], [place] ?[address], [place], [place] ?[address], [place], [place]   ?[address], [place], [place] ?[address], [place], [place] ?[address], [place], [place] ?[address], [place], [place]   ?[address] [place], [place]   ?[address] [place], [place] ?[address], [place]  ?[address], [place], [place] ? ?[facility] ?[address] [place], [place], [place] ?[address] [place], [place] ?[address] [place], [place], [place] ?[address] [place], [place] ?[address] [place], [place], [place] ? ?[address] [place], [place], [place] ?[address], [place], [place] ?[address] [place] [address] [place], [place], [place] ?[address], [place], [place] ?[address] [place], [place] ?[address] [place], [place], [place] ?[address] [place], [place] (effective 04/09/17) ?[address], [place], [place] (effective 04/09/17) ? ? ?

## 2021-12-25 NOTE — ED Provider Notes (Signed)
?[facility] EMERGENCY DEPARTMENT ?Provider Note ? ? ?CSN: [id number] ?Arrival date & time: 12/25/21  1044 ? ?  ? ?History ? ?Chief Complaint  ?Patient presents with  ? URI  ? ? ?Erika Garcia is a 46 y.o. female. ? ?Patient with no pertinent past medical history presents today with complaints of URI symptoms including cough, congestion, and rhinorrhea. She states that same has been ongoing for the past week with associated subjective fevers and loss of taste and smell. She denies any known sick contacts.  She has not tried any medications for her symptoms.  Additionally states that she has lost her appetite as she states that nothing tastes good to her.  Endorses a couple episodes of nausea and nonbloody emesis over the past week as well.  Denies any headaches, chest pain or shortness of breath.  Additionally, patient states that she needs a social work consult as she will soon be unable to fill her medications because her Medicaid expired.  She went to [facility] for same and was told she had to have a provider referral to have Medicaid set up.  No other complaints at this time. ? ?The history is provided by the patient. No language interpreter was used.  ?URI ?Presenting symptoms: congestion, cough and rhinorrhea   ?Presenting symptoms: no fever and no sore throat   ? ?  ? ?Home Medications ?Prior to Admission medications   ?Medication Sig Start Date End Date Taking? Authorizing Provider  ?mirtazapine (REMERON) 7.5 MG tablet Take 1 tablet (7.5 mg total) by mouth at bedtime. 06/24/21   Lenard Lance, FNP  ?Multiple Vitamin (MULTIVITAMIN WITH MINERALS) TABS tablet Take 1 tablet by mouth daily.    [provider]  ?   ? ?Allergies    ?Fluoxetine   ? ?Review of Systems   ?Review of Systems  ?Constitutional:  Negative for chills and fever.  ?HENT:  Positive for congestion and rhinorrhea. Negative for sore throat, tinnitus, trouble swallowing and voice change.   ?Respiratory:  Positive  for cough. Negative for shortness of breath.   ?Gastrointestinal:  Negative for abdominal pain, diarrhea, nausea and vomiting.  ?All other systems reviewed and are negative. ? ?Physical Exam ?Updated Vital Signs ?BP 114/75 (BP Location: Right Arm)   Pulse 100   Temp 98.8 ?F (37.1 ?C) (Oral)   Resp 15   Ht 5\' 6"  (1.676 m)   Wt 49.9 kg   SpO2 98%   BMI 17.75 kg/m?  ?Physical Exam ?Vitals and nursing note reviewed.  ?Constitutional:   ?   General: She is not in acute distress. ?   Appearance: Normal appearance. She is normal weight. She is not ill-appearing, toxic-appearing or diaphoretic.  ?HENT:  ?   Head: Normocephalic and atraumatic.  ?   Nose: Nose normal.  ?   Mouth/Throat:  ?   Mouth: Mucous membranes are moist.  ?Eyes:  ?   Extraocular Movements: Extraocular movements intact.  ?   Pupils: Pupils are equal, round, and reactive to light.  ?Cardiovascular:  ?   Rate and Rhythm: Normal rate and regular rhythm.  ?   Heart sounds: Normal heart sounds.  ?Pulmonary:  ?   Effort: Pulmonary effort is normal. No respiratory distress.  ?   Breath sounds: Normal breath sounds. No stridor. No wheezing, rhonchi or rales.  ?Abdominal:  ?   General: Abdomen is flat.  ?   Palpations: Abdomen is soft.  ?Musculoskeletal:     ?  General: Normal range of motion.  ?   Cervical back: Normal range of motion.  ?Skin: ?   General: Skin is warm and dry.  ?Neurological:  ?   General: No focal deficit present.  ?   Mental Status: She is alert.  ?Psychiatric:     ?   Mood and Affect: Mood normal.     ?   Behavior: Behavior normal.  ? ? ?ED Results / Procedures / Treatments   ?Labs ?(all labs ordered are listed, but only abnormal results are displayed) ?Labs Reviewed  ?RESP PANEL BY RT-PCR (FLU A&B, COVID) [id number]  ? ? ?EKG ?None ? ?Radiology ?No results found. ? ?Procedures ?Procedures  ? ? ?Medications Ordered in ED ?Medications - No data to display ? ?ED Course/ Medical Decision Making/ A&P ?  ?                        ?Medical  Decision Making ? ?Patient presents today with 1 week of URI symptoms. She is afebrile, non-toxic appearing, and in no acute distress with reassuring vital signs. Lung sounds lung sounds present and clear to auscultation bilaterally,  imaging not indicated at this time.  Patient negative for COVID and the flu which is surprising considering she endorses loss of taste and smell. Patients symptoms are consistent with URI, likely viral etiology. Discussed that antibiotics are not indicated for viral infections. Pt will be discharged with symptomatic treatment. I have also put in a consult to social work with information to call patients cell phone in the next few days. She is otherwise stable for discharge at this time. Upon reassessment, patient states that she has been under a lot of stress recently with deaths in her family and issues with housing and financial disparity.  She is tearful during this conversation.  She endorses struggles with mental health in the past, however actively denies SI/HI, or visual/auditory hallucinations.  Offered patient psych consult which she declines, she would like to be discharged at this time. Will include info to [facility] in discharge paperwork.  Patient understanding and amenable with plan, educated on red flag symptoms of prompt immediate return.  Discharged stable condition. Verbalizes understanding and is agreeable with plan. Pt is hemodynamically stable & in NAD prior to dc. ? ? ?Final Clinical Impression(s) / ED Diagnoses ?Final diagnoses:  ?Viral URI with cough  ? ? ?Rx / DC Orders ?ED Discharge Orders   ? ?      Ordered  ?  benzonatate (TESSALON) 100 MG capsule  Every 8 hours       ? 12/25/21 1320  ? ?  ?  ? ?  ?An After Visit Summary was printed and given to the patient. ? ? ?  ?Silva Bandy, PA-C ?12/25/21 1321 ? ?  ?Terald Sleeper, MD ?12/25/21 1516 ? ?

## 2021-12-25 NOTE — Care Management (Addendum)
Consult for medication assistance, patient  is uninsured  and does qualify for MATCH. Will make sure it has not been used in the past year. No scripts ordered yet pending DC ? ? ?MATCH Medication Assistance Card ?Name:  Erika Garcia ?ID: [id number] ?Bin: [id number] RX Group: [id number] ?Discharge Date:  12/25/2021 ?Expiration Date:  01/01/2022 ?(must be filled within 7 days of discharge ? ?MATCH with override, patient states she has no money. Verbalizing fears of not having insurance. Lost Medicaid. Message sent to [facility] for assistance ?

## 2021-12-27 ENCOUNTER — Other Ambulatory Visit: Payer: Self-pay | Admitting: Obstetrics and Gynecology

## 2021-12-27 DIAGNOSIS — Z1231 Encounter for screening mammogram for malignant neoplasm of breast: Secondary | ICD-10-CM

## 2022-02-10 ENCOUNTER — Ambulatory Visit: Payer: Self-pay

## 2022-03-08 ENCOUNTER — Ambulatory Visit (HOSPITAL_COMMUNITY): Payer: No Payment, Other | Admitting: Licensed Clinical Social Worker

## 2022-03-08 ENCOUNTER — Telehealth (HOSPITAL_COMMUNITY): Payer: Self-pay | Admitting: Licensed Clinical Social Worker

## 2022-03-08 ENCOUNTER — Encounter (HOSPITAL_COMMUNITY): Payer: Self-pay

## 2022-03-08 ENCOUNTER — Ambulatory Visit (HOSPITAL_COMMUNITY)
Admission: EM | Admit: 2022-03-08 | Discharge: 2022-03-08 | Disposition: A | Discharge status: Home / Self Care | Payer: No Payment, Other | Attending: Student | Admitting: Student

## 2022-03-08 DIAGNOSIS — F331 Major depressive disorder, recurrent, moderate: Secondary | ICD-10-CM | POA: Insufficient documentation

## 2022-03-08 DIAGNOSIS — F4310 Post-traumatic stress disorder, unspecified: Secondary | ICD-10-CM | POA: Insufficient documentation

## 2022-03-08 MED ORDER — NICOTINE POLACRILEX 2 MG MT GUM: 2.0000 mg | CHEWING_GUM | OROMUCOSAL | Status: DC | PRN

## 2022-03-08 MED ORDER — NICOTINE POLACRILEX 2 MG MT GUM
CHEWING_GUM | OROMUCOSAL | Status: AC
  Administered 2022-03-08: 2 mg
  Filled 2022-03-08: qty 1

## 2022-03-08 NOTE — Discharge Instructions (Addendum)
?  Please come to [facility] (this facility) during walk in hours for appointment with psychiatrist/provider for further medication management and for therapists for therapy.  ? ? Walk-Ins for medication management  are available on Monday, Wednesday, Thursday and Friday from 8am-11am.  It is first come, first -serve; it is best to arrive by 7:00 AM.  ? ? Walk-Ins for therapy are  available on Monday and Wednesday?s  8am-11am.  It is first come, first -serve; it is best to arrive by 7:00 AM.  ? ? ?When you arrive please go upstairs for your appointment. If you are unsure of where to go, inform the front desk that you are here for a walk in appointment and they will assist you with directions upstairs. ? ?Address:  ?[address], in [place], [address] ?Ph: [phone]  ? ? ? ? ? ?
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
## 2022-03-08 NOTE — Telephone Encounter (Signed)
The therapist attempts to reach Erika Garcia regarding her walk out today before being seen; however, the call rings to a busy signal such that the phone may not be in order.  Adam Phenix, [place], LCSW, [facility] 03/08/2022
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
## 2022-03-30 ENCOUNTER — Ambulatory Visit (INDEPENDENT_AMBULATORY_CARE_PROVIDER_SITE_OTHER): Payer: Self-pay | Admitting: Primary Care

## 2022-04-20 ENCOUNTER — Ambulatory Visit (HOSPITAL_COMMUNITY)
Admission: EM | Admit: 2022-04-20 | Discharge: 2022-04-20 | Disposition: A | Discharge status: Home / Self Care | Payer: No Payment, Other | Attending: Psychiatry | Admitting: Psychiatry

## 2022-04-20 DIAGNOSIS — F339 Major depressive disorder, recurrent, unspecified: Secondary | ICD-10-CM | POA: Insufficient documentation

## 2022-04-20 DIAGNOSIS — F411 Generalized anxiety disorder: Secondary | ICD-10-CM | POA: Insufficient documentation

## 2022-04-20 DIAGNOSIS — Z91148 Patient's other noncompliance with medication regimen for other reason: Secondary | ICD-10-CM | POA: Insufficient documentation

## 2022-04-20 DIAGNOSIS — R634 Abnormal weight loss: Secondary | ICD-10-CM | POA: Insufficient documentation

## 2022-04-20 NOTE — ED Provider Notes (Signed)
Behavioral Health Urgent Care Medical Screening Exam  Patient Name: Erika Garcia MRN: [id number] Date of Evaluation: 04/20/22 Chief Complaint:   Diagnosis:  Final diagnoses:  Episode of recurrent major depressive disorder, unspecified depression episode severity (HCC)  Generalized anxiety disorder  Noncompliance with medication regimen    History of Present illness: Erika Garcia is a 46 y.o. female. With a history of depression and anxiety, present to [facility],  stating she has not taken her medication in one year.  Per the patient she is currently not seeing a psychiatrist or therapist.  Pt stated,  she was prescribe a lot of medication but none help.  According to patient she move to [facility] a year ago, she stated she is on a waiting list to see a psychiatrist.   Triage notes, Pt presents to [facility], voluntarily and unaccompanied at this time with a complaint of worsening depression and anxiety. Pt is very sad and has flat affect, but states nothing in particular triggered her to feel this way. Pt did report dealing with stress and things going on her life (chose not to elaborate). Pt states " I just been thinking a lot and sometimes I can't even stand to look at myself in the mirror". Pt reports being off of medication for depression for over a year now, but was unable to remember what she was prescribed at that time. Pt denies SI, HI, AVH and substance/alchol use.  Face to face observation of patient,  she is alert and oriented x 4,  speech is clear,  maintain minimal eye contact.  Pt mood is depress,  affect flat.  Pt denies SI,  HI,  AVH or paranoia.  Denies alcohol or drug use.  Pt report she has lost weight because she is depressed.  Discuss with patient that she could utilize the [facility] psychiatry clinic,  will also provide patient with outpatient resources.  Pt in agreement.   Psychiatric Specialty Exam  Presentation  General Appearance:Casual  Eye  Contact:Fair  Speech:Clear and Coherent  Speech Volume:Normal  Handedness:Ambidextrous   Mood and Affect  Mood:Anxious; Depressed  Affect:Appropriate   Thought Process  Thought Processes:Coherent  Descriptions of Associations:Circumstantial  Orientation:Full (Time, Place and Person)  Thought Content:WDL    Hallucinations:None  Ideas of Reference:None  Suicidal Thoughts:No  Homicidal Thoughts:No   Sensorium  Memory:Immediate Fair  Judgment:Fair  Insight:Fair   Executive Functions  Concentration:Fair  Attention Span:Fair  Recall:Fair  Fund of Knowledge:Fair  Language:Fair   Psychomotor Activity  Psychomotor Activity:Normal   Assets  Assets:Communication Skills; Desire for Improvement   Sleep  Sleep:Fair  Number of hours: No data recorded  Nutritional Assessment (For OBS and FBC admissions only) Has the patient had a weight loss or gain of 10 pounds or more in the last 3 months?: No Has the patient had a decrease in food intake/or appetite?: No Does the patient have dental problems?: No Does the patient have eating habits or behaviors that may be indicators of an eating disorder including binging or inducing vomiting?: No Has the patient recently lost weight without trying?: 0 Has the patient been eating poorly because of a decreased appetite?: 0 Malnutrition Screening Tool Score: 0    Physical Exam: Physical Exam HENT:     Head: Normocephalic.     Nose: Nose normal.  Cardiovascular:     Rate and Rhythm: Normal rate.  Pulmonary:     Effort: Pulmonary effort is normal.  Musculoskeletal:        General: Normal  range of motion.     Cervical back: Normal range of motion.  Skin:    General: Skin is warm.  Neurological:     General: No focal deficit present.     Mental Status: She is alert.  Psychiatric:        Mood and Affect: Mood normal.        Behavior: Behavior normal.        Thought Content: Thought content normal.         Judgment: Judgment normal.    Review of Systems  Constitutional: Negative.   HENT: Negative.    Eyes: Negative.   Respiratory: Negative.    Cardiovascular: Negative.   Gastrointestinal: Negative.   Genitourinary: Negative.   Musculoskeletal: Negative.   Skin: Negative.   Neurological: Negative.   Endo/Heme/Allergies: Negative.   Psychiatric/Behavioral:  Positive for depression. The patient is nervous/anxious.    Blood pressure 118/80, pulse 84, temperature 98.7 F (37.1 C), temperature source Oral, resp. rate 18, SpO2 100 %. There is no height or weight on file to calculate BMI.  Musculoskeletal: Strength & Muscle Tone: within normal limits Gait & Station: normal Patient leans: N/A   [facility] MSE Discharge Disposition for Follow up and Recommendations: Based on my evaluation the patient does not appear to have an emergency medical condition and can be discharged with resources and follow up care in outpatient services for Medication Management and Individual Therapy   Sindy Guadeloupe, NP 04/20/2022, 10:03 PM

## 2022-04-20 NOTE — Progress Notes (Signed)
[facility] uploaded resources into AVS for outpatient therapy and medication management.    Rubyann Lingle [facility] 

## 2022-04-20 NOTE — ED Triage Notes (Signed)
Pt presents to [facility], voluntarily and unaccompanied at this time with a complaint of worsening depression and anxiety. Pt is very sad and has flat affect, but states nothing in particular triggered her to feel this way. Pt did report dealing with stress and things going on her life (chose not to elaborate). Pt states " I just been thinking a lot and sometimes I can't even stand to look at myself in the mirror". Pt reports being off of medication for depression for over a year now, but was unable to remember what she was prescribed at that time. Pt denies SI, HI, AVH and substance/alchol use.

## 2022-04-21 ENCOUNTER — Telehealth: Payer: No Typology Code available for payment source | Admitting: Family Medicine

## 2022-04-21 ENCOUNTER — Other Ambulatory Visit (HOSPITAL_COMMUNITY): Payer: Self-pay

## 2022-04-21 DIAGNOSIS — F339 Major depressive disorder, recurrent, unspecified: Secondary | ICD-10-CM

## 2022-04-21 NOTE — Progress Notes (Signed)
[place]   Was trying to set up therapy for outpatient needs, will follow up with resources provided from ED visit yesterday.  Patient acknowledged agreement and understanding of the plan.

## 2022-04-21 NOTE — Patient Instructions (Signed)
Follow up with the resources on the print out the ED gave you yesterday evening.

## 2022-05-05 ENCOUNTER — Other Ambulatory Visit (HOSPITAL_COMMUNITY): Payer: Self-pay

## 2022-05-11 ENCOUNTER — Other Ambulatory Visit (HOSPITAL_COMMUNITY): Payer: Self-pay

## 2022-05-27 ENCOUNTER — Other Ambulatory Visit (HOSPITAL_COMMUNITY): Payer: Self-pay

## 2022-06-04 ENCOUNTER — Other Ambulatory Visit (HOSPITAL_COMMUNITY): Payer: Self-pay

## 2022-06-06 ENCOUNTER — Ambulatory Visit (INDEPENDENT_AMBULATORY_CARE_PROVIDER_SITE_OTHER): Payer: Medicaid Other | Admitting: Primary Care

## 2022-06-06 ENCOUNTER — Encounter (INDEPENDENT_AMBULATORY_CARE_PROVIDER_SITE_OTHER): Payer: Self-pay

## 2022-06-22 ENCOUNTER — Other Ambulatory Visit (HOSPITAL_COMMUNITY): Payer: Self-pay

## 2022-07-05 ENCOUNTER — Other Ambulatory Visit (HOSPITAL_COMMUNITY): Payer: Self-pay

## 2022-07-24 ENCOUNTER — Other Ambulatory Visit: Payer: Self-pay

## 2022-07-24 ENCOUNTER — Emergency Department (HOSPITAL_COMMUNITY)
Admission: EM | Admit: 2022-07-24 | Discharge: 2022-07-25 | Disposition: A | Discharge status: Home / Self Care | Payer: No Typology Code available for payment source | Attending: Emergency Medicine | Admitting: Emergency Medicine

## 2022-07-24 ENCOUNTER — Emergency Department (HOSPITAL_COMMUNITY): Payer: No Typology Code available for payment source

## 2022-07-24 ENCOUNTER — Other Ambulatory Visit (HOSPITAL_COMMUNITY): Payer: No Typology Code available for payment source

## 2022-07-24 DIAGNOSIS — R569 Unspecified convulsions: Secondary | ICD-10-CM | POA: Insufficient documentation

## 2022-07-24 DIAGNOSIS — M545 Low back pain, unspecified: Secondary | ICD-10-CM | POA: Diagnosis not present

## 2022-07-24 LAB — URINALYSIS, ROUTINE W REFLEX MICROSCOPIC
Bacteria, UA: NONE SEEN
Bilirubin Urine: NEGATIVE
Glucose, UA: NEGATIVE mg/dL
Hgb urine dipstick: NEGATIVE
Ketones, ur: NEGATIVE mg/dL
Nitrite: NEGATIVE
Protein, ur: NEGATIVE mg/dL
Specific Gravity, Urine: 1.002 — ABNORMAL LOW (ref 1.005–1.030)
pH: 6 (ref 5.0–8.0)

## 2022-07-24 LAB — CBC
HCT: 31.3 % — ABNORMAL LOW (ref 36.0–46.0)
Hemoglobin: 10.6 g/dL — ABNORMAL LOW (ref 12.0–15.0)
MCH: 25.6 pg — ABNORMAL LOW (ref 26.0–34.0)
MCHC: 33.9 g/dL (ref 30.0–36.0)
MCV: 75.6 fL — ABNORMAL LOW (ref 80.0–100.0)
Platelets: 347 10*3/uL (ref 150–400)
RBC: 4.14 MIL/uL (ref 3.87–5.11)
RDW: 14.9 % (ref 11.5–15.5)
WBC: 13.1 10*3/uL — ABNORMAL HIGH (ref 4.0–10.5)
nRBC: 0 % (ref 0.0–0.2)

## 2022-07-24 LAB — BASIC METABOLIC PANEL
Anion gap: 10 (ref 5–15)
BUN: 5 mg/dL — ABNORMAL LOW (ref 6–20)
CO2: 20 mmol/L — ABNORMAL LOW (ref 22–32)
Calcium: 8.9 mg/dL (ref 8.9–10.3)
Chloride: 111 mmol/L (ref 98–111)
Creatinine, Ser: 0.64 mg/dL (ref 0.44–1.00)
GFR, Estimated: >60 mL/min (ref >60)
Glucose, Bld: 92 mg/dL (ref 70–99)
Potassium: 3.3 mmol/L — ABNORMAL LOW (ref 3.5–5.1)
Sodium: 141 mmol/L (ref 135–145)

## 2022-07-24 LAB — PREGNANCY, URINE: Preg Test, Ur: NEGATIVE

## 2022-07-24 MED ORDER — ACETAMINOPHEN 500 MG PO TABS
1000.0000 mg | ORAL_TABLET | ORAL | Status: AC
  Administered 2022-07-24: 1000 mg via ORAL
  Filled 2022-07-24: qty 2

## 2022-07-24 MED ORDER — POTASSIUM CHLORIDE CRYS ER 20 MEQ PO TBCR
40.0000 meq | EXTENDED_RELEASE_TABLET | Freq: Once | ORAL | Status: AC
  Administered 2022-07-24: 40 meq via ORAL
  Filled 2022-07-24: qty 2

## 2022-07-24 MED ORDER — LEVETIRACETAM IN NACL 1000 MG/100ML IV SOLN: 1000.0000 mg | Freq: Two times a day (BID) | INTRAVENOUS | Status: DC

## 2022-07-24 NOTE — ED Provider Notes (Signed)
[facility] EMERGENCY DEPARTMENT Provider Note   CSN: [id number] Arrival date & time: 07/24/22  2123     History Chief Complaint  Patient presents with   Seizures    Erika Garcia is a 46 y.o. female.   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  Bipolar 1, asthma, anxiety, depression  She states she had a seizure earlier today.  She states that she has not taken her Keppra for 1 month.  She states that she generally takes 500 mg twice daily.  She states that "I felt like it was getting better."  She states that she has had episodes where she lapsed on her medications in the past.  Notably she states that she had a seizure 2 months ago.    Patient states that she only drinks once or twice a week and usually 1 beer.  She has never had seizures or delirium tremens from alcohol withdrawal.  Pharmacy technician verified that patient does not have a Keppra prescription.  On continued discussion with patient she states that she is on a different medication.      Home Medications Prior to Admission medications   Medication Sig Start Date End Date Taking? Authorizing Provider  ARIPiprazole (ABILIFY) 10 MG tablet Take 10 mg by mouth at bedtime. 06/01/22  Yes [provider]  escitalopram (LEXAPRO) 10 MG tablet Take 10 mg by mouth daily. 06/10/22  Yes [provider]  mirtazapine (REMERON) 15 MG tablet Take 15 mg by mouth at bedtime. 06/10/22  Yes [provider]  prazosin (MINIPRESS) 1 MG capsule Take 1 mg by mouth at bedtime. 06/10/22  Yes [provider]      Allergies    Fluoxetine    Review of Systems   Review of Systems  Neurological:  Positive for seizures.    Physical Exam Updated Vital Signs BP 107/77   Pulse 74   Temp 98.6 F (37 C) (Oral)   Resp 18   Ht 5\' 6"  (1.676 m)   Wt 49.9 kg   SpO2  100%   BMI 17.75 kg/m  Physical Exam Vitals and nursing note reviewed.  Constitutional:      General: She is not in acute distress. HENT:     Head: Normocephalic and atraumatic.     Nose: Nose normal.  Eyes:     General: No scleral icterus. Cardiovascular:     Rate and Rhythm: Normal rate and regular rhythm.     Pulses: Normal pulses.     Heart sounds: Normal heart sounds.  Pulmonary:     Effort: Pulmonary effort is normal. No respiratory distress.     Breath sounds: No wheezing.  Abdominal:     Palpations: Abdomen is soft.     Tenderness: There is no abdominal tenderness.  Musculoskeletal:     Cervical back: Normal range of motion.     Right lower leg: No edema.     Left lower leg: No edema.     Comments: Tenderness palpation of bilateral hips and midline lumbar spine  Skin:    General: Skin is warm and dry.     Capillary Refill: Capillary refill takes less than 2 seconds.  Neurological:     Mental Status: She is alert. Mental status is at baseline.     Comments: Alert and oriented to self, place, time and event.  Speech is fluent, clear without dysarthria or dysphasia.   Strength 5/5 in upper/lower extremities   Sensation intact in upper/lower extremities   Normal gait.  Negative Romberg. No pronator drift.  CN I not tested  CN II grossly intact visual fields bilaterally. Did not visualize posterior eye.  CN III, IV, VI PERRLA and EOMs intact bilaterally  CN V Intact sensation to sharp and light touch to the face  CN VII facial movements symmetric  CN VIII not tested  CN IX, X no uvula deviation, symmetric rise of soft palate  CN XI 5/5 SCM and trapezius strength bilaterally  CN XII Midline tongue protrusion, symmetric L/R movements   Psychiatric:        Mood and Affect: Mood normal.        Behavior: Behavior normal.     ED Results / Procedures / Treatments   Labs (all labs ordered are listed, but only abnormal results are displayed) Labs Reviewed  BASIC  METABOLIC PANEL - Abnormal; Notable for the following components:      Result Value   Potassium 3.3 (*)    CO2 20 (*)    BUN 5 (*)    All other components within normal limits  CBC - Abnormal; Notable for the following components:   WBC 13.1 (*)    Hemoglobin 10.6 (*)    HCT 31.3 (*)    MCV 75.6 (*)    MCH 25.6 (*)    All other components within normal limits  URINALYSIS, ROUTINE W REFLEX MICROSCOPIC - Abnormal; Notable for the following components:   Color, Urine COLORLESS (*)    Specific Gravity, Urine 1.002 (*)    Leukocytes,Ua SMALL (*)    All other components within normal limits  PREGNANCY, URINE  I-STAT BETA HCG BLOOD, ED ([facility], [facility], [facility] ONLY)    EKG None  Radiology DG Pelvis 1-2 Views  Result Date: 07/24/2022 CLINICAL DATA:  Fall, hip and buttock pain EXAM: PELVIS - 1-2 VIEW COMPARISON:  None Available. FINDINGS: There is no evidence of pelvic fracture or diastasis. No pelvic bone lesions are seen. IMPRESSION: Negative. Electronically Signed   By: Charlett Nose M.D.   On: 07/24/2022 22:48    Procedures Procedures    Medications Ordered in ED Medications  acetaminophen (TYLENOL) tablet 1,000 mg (has no administration in time range)  potassium chloride SA (KLOR-CON M) CR tablet 40 mEq (has no administration in time range)    ED Course/ Medical Decision Making/ A&P                           Medical Decision Making Amount and/or Complexity of Data Reviewed Labs: ordered. Radiology: ordered.  Risk OTC drugs. Prescription drug management.   Patient is a 46 year old female with past medical history significant for seizures which she states she was first diagnosed with at the age of 89.  Bipolar 1, asthma, anxiety, depression  She states she had a seizure earlier today.  She states that she has not taken her Keppra for 1 month.  She states that she generally takes 500 mg twice daily.  She states that "I felt like it was getting better."  She states that she has had  episodes where she lapsed on her medications in the past.  Notably she states that she had a seizure 2 months ago.    Patient states that she only drinks once or twice a week and usually 1 beer.  She has  never had seizures or delirium tremens from alcohol withdrawal.  Pharmacy technician verified that patient does not have a Keppra prescription.  On continued discussion with patient she states that she is on a different medication.   Physical exam reassuring.  She has no neurodeficits.  Does have some midline L-spine tenderness.  Did have a fall recently.  Will obtain CT L-spine and CT head as well as plain x-ray of pelvis.  CBC with anemia seems that she has variably had this in the past.  BMP with mild hypokalemia.  Urinalysis unremarkable.  Pregnancy test negative.  Plain x-ray of pelvis without fracture.  CT head and CT lumbar spine pending.  EKG without evidence of ischemia.  Plan is to discharge patient home to continue her Depakote that she takes 400 mg daily which is a somewhat reasonable dose for bipolar disorder which is a current diagnosis of hers.  She will follow-up with neurology.  Pt care transferred to night team. If CT are negative will DC home.    Final Clinical Impression(s) / ED Diagnoses Final diagnoses:  Seizure-like activity ([place])  Acute low back pain without sciatica, unspecified back pain laterality    Rx / DC Orders ED Discharge Orders     None         Tedd Sias, [place] 07/24/22 2319    Isla Pence, MD 07/25/22 1504

## 2022-07-24 NOTE — Discharge Instructions (Signed)
Please follow-up with a new allergist.  I have given you the information for one of our neurology clinics.  You may always return to the emergency room for any new or concerning symptoms.  Please restart taking your Depakote that you are currently prescribed.  Your CT scans are without any fractures.

## 2022-07-24 NOTE — ED Notes (Signed)
Patient is very emotional at this time, per pt reports "I can't stand this pain". Patient repositioned to comfort, and pain meds given.

## 2022-07-24 NOTE — ED Triage Notes (Signed)
BIB EMS for an episode of seizures. Has been off of her keppra for a month. ETOH on board

## 2022-07-25 NOTE — ED Provider Notes (Signed)
Results for orders placed or performed during the hospital encounter of 25/85/27  Basic metabolic panel  Result Value Ref Range   Sodium 141 135 - 145 mmol/L   Potassium 3.3 (L) 3.5 - 5.1 mmol/L   Chloride 111 98 - 111 mmol/L   CO2 20 (L) 22 - 32 mmol/L   Glucose, Bld 92 70 - 99 mg/dL   BUN 5 (L) 6 - 20 mg/dL   Creatinine, Ser 0.64 0.44 - 1.00 mg/dL   Calcium 8.9 8.9 - 10.3 mg/dL   GFR, Estimated >60 >60 mL/min   Anion gap 10 5 - 15  CBC  Result Value Ref Range   WBC 13.1 (H) 4.0 - 10.5 K/uL   RBC 4.14 3.87 - 5.11 MIL/uL   Hemoglobin 10.6 (L) 12.0 - 15.0 g/dL   HCT 31.3 (L) 36.0 - 46.0 %   MCV 75.6 (L) 80.0 - 100.0 fL   MCH 25.6 (L) 26.0 - 34.0 pg   MCHC 33.9 30.0 - 36.0 g/dL   RDW 14.9 11.5 - 15.5 %   Platelets 347 150 - 400 K/uL   nRBC 0.0 0.0 - 0.2 %  Urinalysis, Routine w reflex microscopic Urine, Clean Catch  Result Value Ref Range   Color, Urine COLORLESS (A) YELLOW   APPearance CLEAR CLEAR   Specific Gravity, Urine 1.002 (L) 1.005 - 1.030   pH 6.0 5.0 - 8.0   Glucose, UA NEGATIVE NEGATIVE mg/dL   Hgb urine dipstick NEGATIVE NEGATIVE   Bilirubin Urine NEGATIVE NEGATIVE   Ketones, ur NEGATIVE NEGATIVE mg/dL   Protein, ur NEGATIVE NEGATIVE mg/dL   Nitrite NEGATIVE NEGATIVE   Leukocytes,Ua SMALL (A) NEGATIVE   RBC / HPF 0-5 0 - 5 RBC/hpf   WBC, UA 0-5 0 - 5 WBC/hpf   Bacteria, UA NONE SEEN NONE SEEN   Squamous Epithelial / LPF 0-5 0 - 5  Pregnancy, urine  Result Value Ref Range   Preg Test, Ur NEGATIVE NEGATIVE   CT Lumbar Spine Wo Contrast  Result Date: 07/24/2022 CLINICAL DATA:  Low back pain and trauma EXAM: CT LUMBAR SPINE WITHOUT CONTRAST TECHNIQUE: Multidetector CT imaging of the lumbar spine was performed without intravenous contrast administration. Multiplanar CT image reconstructions were also generated. RADIATION DOSE REDUCTION: This exam was performed according to the departmental dose-optimization program which includes automated exposure control,  adjustment of the mA and/or kV according to patient size and/or use of iterative reconstruction technique. COMPARISON:  None Available. FINDINGS: Segmentation: 5 lumbar type vertebrae. Alignment: Normal. Vertebrae: No acute fracture or focal pathologic process. Paraspinal and other soft tissues: Negative. Disc levels: No spinal canal or neural foraminal stenosis. IMPRESSION: No acute fracture or traumatic listhesis of the lumbar spine. Electronically Signed   By: Ulyses Jarred M.D.   On: 07/24/2022 23:40   CT HEAD WO CONTRAST (5MM)  Result Date: 07/24/2022 CLINICAL DATA:  Recent seizure activity EXAM: CT HEAD WITHOUT CONTRAST TECHNIQUE: Contiguous axial images were obtained from the base of the skull through the vertex without intravenous contrast. RADIATION DOSE REDUCTION: This exam was performed according to the departmental dose-optimization program which includes automated exposure control, adjustment of the mA and/or kV according to patient size and/or use of iterative reconstruction technique. COMPARISON:  11/14/2015 FINDINGS: Brain: No evidence of acute infarction, hemorrhage, hydrocephalus, extra-axial collection or mass lesion/mass effect. Vascular: No hyperdense vessel or unexpected calcification. Skull: Normal. Negative for fracture or focal lesion. Sinuses/Orbits: No acute finding. Other: None. IMPRESSION: No acute intracranial abnormality noted. Electronically Signed  By: Inez Catalina M.D.   On: 07/24/2022 23:38   DG Pelvis 1-2 Views  Result Date: 07/24/2022 CLINICAL DATA:  Fall, hip and buttock pain EXAM: PELVIS - 1-2 VIEW COMPARISON:  None Available. FINDINGS: There is no evidence of pelvic fracture or diastasis. No pelvic bone lesions are seen. IMPRESSION: Negative. Electronically Signed   By: Rolm Baptise M.D.   On: 07/24/2022 22:48    CT's negative.  Will discharge home with OP neurology follow-up.  Return here for new concerns.   Larene Pickett, PA-C 07/25/22 [id number]    Merrily Pew, MD 07/25/22 [phone]

## 2022-08-15 ENCOUNTER — Other Ambulatory Visit (HOSPITAL_COMMUNITY): Payer: Self-pay

## 2022-08-19 ENCOUNTER — Other Ambulatory Visit (HOSPITAL_COMMUNITY): Payer: Self-pay

## 2022-08-29 ENCOUNTER — Other Ambulatory Visit (HOSPITAL_COMMUNITY): Payer: Self-pay

## 2023-10-10 ENCOUNTER — Other Ambulatory Visit: Payer: Self-pay

## 2023-10-10 ENCOUNTER — Emergency Department (HOSPITAL_COMMUNITY)
Admission: EM | Admit: 2023-10-10 | Discharge: 2023-10-10 | Discharge status: Against Medical Advice | Payer: MEDICAID | Attending: Emergency Medicine | Admitting: Emergency Medicine

## 2023-10-10 DIAGNOSIS — R4182 Altered mental status, unspecified: Secondary | ICD-10-CM | POA: Diagnosis present

## 2023-10-10 DIAGNOSIS — R569 Unspecified convulsions: Secondary | ICD-10-CM | POA: Insufficient documentation

## 2023-10-10 DIAGNOSIS — Z7951 Long term (current) use of inhaled steroids: Secondary | ICD-10-CM | POA: Diagnosis not present

## 2023-10-10 DIAGNOSIS — J45909 Unspecified asthma, uncomplicated: Secondary | ICD-10-CM | POA: Diagnosis not present

## 2023-10-10 DIAGNOSIS — Z5329 Procedure and treatment not carried out because of patient's decision for other reasons: Secondary | ICD-10-CM | POA: Diagnosis not present

## 2023-10-10 DIAGNOSIS — R55 Syncope and collapse: Secondary | ICD-10-CM | POA: Diagnosis not present

## 2023-10-10 DIAGNOSIS — Z79899 Other long term (current) drug therapy: Secondary | ICD-10-CM | POA: Diagnosis not present

## 2023-10-10 LAB — BASIC METABOLIC PANEL
Anion gap: 11 (ref 5–15)
BUN: <5 mg/dL — ABNORMAL LOW (ref 6–20)
CO2: 22 mmol/L (ref 22–32)
Calcium: 9 mg/dL (ref 8.9–10.3)
Chloride: 111 mmol/L (ref 98–111)
Creatinine, Ser: 0.6 mg/dL (ref 0.44–1.00)
GFR, Estimated: >60 mL/min (ref >60)
Glucose, Bld: 92 mg/dL (ref 70–99)
Potassium: 3 mmol/L — ABNORMAL LOW (ref 3.5–5.1)
Sodium: 144 mmol/L (ref 135–145)

## 2023-10-10 LAB — CBC WITH DIFFERENTIAL/PLATELET
Abs Immature Granulocytes: 0.04 10*3/uL (ref 0.00–0.07)
Basophils Absolute: 0 10*3/uL (ref 0.0–0.1)
Basophils Relative: 1 %
Eosinophils Absolute: 0 10*3/uL (ref 0.0–0.5)
Eosinophils Relative: 0 %
HCT: 34.5 % — ABNORMAL LOW (ref 36.0–46.0)
Hemoglobin: 11.3 g/dL — ABNORMAL LOW (ref 12.0–15.0)
Immature Granulocytes: 1 %
Lymphocytes Relative: 30 %
Lymphs Abs: 2.3 10*3/uL (ref 0.7–4.0)
MCH: 25 pg — ABNORMAL LOW (ref 26.0–34.0)
MCHC: 32.8 g/dL (ref 30.0–36.0)
MCV: 76.3 fL — ABNORMAL LOW (ref 80.0–100.0)
Monocytes Absolute: 0.3 10*3/uL (ref 0.1–1.0)
Monocytes Relative: 4 %
Neutro Abs: 5 10*3/uL (ref 1.7–7.7)
Neutrophils Relative %: 64 %
Platelets: 334 10*3/uL (ref 150–400)
RBC: 4.52 MIL/uL (ref 3.87–5.11)
RDW: 14.2 % (ref 11.5–15.5)
WBC: 7.7 10*3/uL (ref 4.0–10.5)
nRBC: 0 % (ref 0.0–0.2)

## 2023-10-10 LAB — ETHANOL: Alcohol, Ethyl (B): 235 mg/dL — ABNORMAL HIGH (ref <10)

## 2023-10-10 NOTE — ED Notes (Signed)
 Pt came out of the room and demanded her IV be removed. Pt was encouraged to stay and receive the full treatment. Pt yells "get this shit out my arm, I'm leaving. My ride is here anyways". Provider made aware.

## 2023-10-10 NOTE — ED Provider Notes (Signed)
  EMERGENCY DEPARTMENT AT [facility] Provider Note   CSN: [id number] Arrival date & time: 10/10/23  0222     History  Chief Complaint  Patient presents with   Altered Mental Status    Erika Garcia is a 47 y.o. female.  HPI     This is a 47 year old female with a history of seizure/pseudoseizure, bipolar disorder, anxiety who presents with an episode of loss of consciousness and seizure-like activity.  Per EMS report, they were called for an unresponsive.  Per prior she was unresponsive but then she was arousable to EMS.  She had an episode of seizure-like activity; however they suspected pseudoseizures.  Patient states she takes seizure medicines but has not been taking any of her medications.  I do not see any seizure medications listed on her medication reconciliation and she cannot tell me what she normally takes.  She is also not taking her psychiatric medications.  She does endorse alcohol and marijuana use tonight.  Denies any pain.  Home Medications Prior to Admission medications   Medication Sig Start Date End Date Taking? Authorizing Provider  ARIPiprazole  (ABILIFY ) 10 MG tablet Take 10 mg by mouth at bedtime. 06/01/22   [provider]  escitalopram  (LEXAPRO ) 10 MG tablet Take 10 mg by mouth daily. 06/10/22   [provider]  mirtazapine  (REMERON ) 15 MG tablet Take 15 mg by mouth at bedtime. 06/10/22   [provider]  prazosin (MINIPRESS) 1 MG capsule Take 1 mg by mouth at bedtime. 06/10/22   [provider]      Allergies    Fluoxetine    Review of Systems   Review of Systems  Constitutional:  Negative for fever.  Respiratory:  Negative for shortness of breath.   Cardiovascular:  Negative for chest pain.  Gastrointestinal:  Negative for abdominal pain.  Neurological:  Positive for seizures.  All other systems reviewed and are negative.   Physical Exam Updated Vital Signs BP (!) 117/93   Pulse 73    SpO2 100%  Physical Exam Vitals and nursing note reviewed.  Constitutional:      Appearance: She is well-developed. She is not ill-appearing.  HENT:     Head: Normocephalic and atraumatic.     Mouth/Throat:     Mouth: Mucous membranes are dry.  Eyes:     Pupils: Pupils are equal, round, and reactive to light.  Cardiovascular:     Rate and Rhythm: Normal rate and regular rhythm.     Heart sounds: Normal heart sounds.  Pulmonary:     Effort: Pulmonary effort is normal. No respiratory distress.     Breath sounds: No wheezing.  Abdominal:     General: Bowel sounds are normal.     Palpations: Abdomen is soft.  Musculoskeletal:     Cervical back: Neck supple.  Skin:    General: Skin is warm and dry.  Neurological:     Mental Status: She is alert and oriented to person, place, and time.  Psychiatric:        Mood and Affect: Mood normal.     ED Results / Procedures / Treatments   Labs (all labs ordered are listed, but only abnormal results are displayed) Labs Reviewed  CBC WITH DIFFERENTIAL/PLATELET - Abnormal; Notable for the following components:      Result Value   Hemoglobin 11.3 (*)    HCT 34.5 (*)    MCV 76.3 (*)    MCH 25.0 (*)  All other components within normal limits  BASIC METABOLIC PANEL  ETHANOL    EKG None  Radiology No results found.  Procedures Procedures    Medications Ordered in ED Medications - No data to display  ED Course/ Medical Decision Making/ A&P Clinical Course as of 10/10/23 0400  Tue Oct 10, 2023  0354 Nursing report, patient reported that she could not wait any longer and her ride is here.  She has been observed for 1.5 hours without any recurrence of seizure activity.  She is awake, alert, ambulatory independently.  Patient left AGAINST MEDICAL ADVICE. [CH]    Clinical Course User Index [CH] Loucille Takach, Charmaine FALCON, MD                                 Medical Decision Making Amount and/or Complexity of Data Reviewed Labs:  ordered.   This patient presents to the ED for concern of altered mental status, seizure, this involves an extensive number of treatment options, and is a complaint that carries with it a high risk of complications and morbidity.  I considered the following differential and admission for this acute, potentially life threatening condition.  The differential diagnosis includes seizure activity, syncope, polysubstance abuse, intoxication  MDM:    This is a 47 year old female who presents after reportedly being found unconscious.  She is awake, alert, oriented.  Reports history of seizures but does not take seizure medications per chart review.  She states she is not taking any of her medications.  Does report alcohol and drug use.  There is initially agitated but was redirectable.  Has no physical complaints.  Exam is benign.  Labs obtained.  Unfortunately prior to full labs being return, patient left.  She stated her ride was here.  She was ambulatory independently.  She appeared to have capacity to make medical decisions.  Only lab work available at that time was a CBC which was reassuring.  (Labs, imaging, consults)  Labs: I Ordered, and personally interpreted labs.  The pertinent results include: BC, BMP, EtOH  Imaging Studies ordered: I ordered imaging studies including none I independently visualized and interpreted imaging. I agree with the radiologist interpretation  Additional history obtained from chart review.  External records from outside source obtained and reviewed including prior evaluations  Cardiac Monitoring: The patient was maintained on a cardiac monitor.  If on the cardiac monitor, I personally viewed and interpreted the cardiac monitored which showed an underlying rhythm of: Sinus  Reevaluation: After the interventions noted above, I reevaluated the patient and found that they have :stayed the same  Social Determinants of Health:  lives independently  Disposition:  AMA  Co morbidities that complicate the patient evaluation  Past Medical History:  Diagnosis Date   Anxiety    Asthma    Bipolar 1 disorder (HCC)    Depression    Seizure (H[facility])    Seizures ([facility])      Medicines No orders of the defined types were placed in this encounter.   I have reviewed the patients home medicines and have made adjustments as needed  Problem List / ED Course: Problem List Items Addressed This Visit   None Visit Diagnoses       Altered mental status, unspecified altered mental status type    -  Primary                   Final Clinical Impression(s) /  ED Diagnoses Final diagnoses:  Altered mental status, unspecified altered mental status type    Rx / DC Orders ED Discharge Orders     None         Wenonah Milo, Charmaine FALCON, MD 10/10/23 [phone]

## 2023-10-10 NOTE — ED Triage Notes (Signed)
 Pt BIB [facility] from home. EMS called out initially for unresponsive; once EMS arrived pt responded to painful stimulation. On arrival to the ED pt was ambulatory, alert, being loud and inappropriate towards staff. Pt admits to smoking marijuana and drinking 1/4 bottle of everclear liquor.  Pt has hx of seizure but has not taking medications for the last 3 months.

## 2023-10-10 NOTE — ED Notes (Signed)
 Pt refused EKG.

## 2023-12-25 ENCOUNTER — Ambulatory Visit: Admission: EM | Admit: 2023-12-25 | Discharge: 2023-12-25 | Disposition: A | Discharge status: Home / Self Care

## 2023-12-25 ENCOUNTER — Encounter: Payer: Self-pay | Admitting: Emergency Medicine

## 2023-12-25 NOTE — ED Triage Notes (Signed)
 Pt presents with abscess located on left side of mouth in the back. Pt says it is extremely painful and causing emesis. First noticed about 2 days ago when she woke up to swollen face.

## 2024-01-25 ENCOUNTER — Encounter: Payer: Self-pay | Admitting: Family

## 2024-01-25 ENCOUNTER — Ambulatory Visit (INDEPENDENT_AMBULATORY_CARE_PROVIDER_SITE_OTHER): Payer: MEDICAID | Admitting: Family

## 2024-01-25 ENCOUNTER — Encounter (INDEPENDENT_AMBULATORY_CARE_PROVIDER_SITE_OTHER): Payer: Self-pay

## 2024-01-25 VITALS — BP 115/76 | HR 66 | Temp 98.3°F | Resp 16 | Ht 69.0 in | Wt 122.8 lb

## 2024-01-25 DIAGNOSIS — M549 Dorsalgia, unspecified: Secondary | ICD-10-CM

## 2024-01-25 DIAGNOSIS — Z23 Encounter for immunization: Secondary | ICD-10-CM

## 2024-01-25 DIAGNOSIS — Z1159 Encounter for screening for other viral diseases: Secondary | ICD-10-CM

## 2024-01-25 DIAGNOSIS — R5383 Other fatigue: Secondary | ICD-10-CM

## 2024-01-25 DIAGNOSIS — G8929 Other chronic pain: Secondary | ICD-10-CM

## 2024-01-25 DIAGNOSIS — R630 Anorexia: Secondary | ICD-10-CM | POA: Diagnosis not present

## 2024-01-25 DIAGNOSIS — Z1231 Encounter for screening mammogram for malignant neoplasm of breast: Secondary | ICD-10-CM

## 2024-01-25 DIAGNOSIS — G43909 Migraine, unspecified, not intractable, without status migrainosus: Secondary | ICD-10-CM | POA: Diagnosis not present

## 2024-01-25 DIAGNOSIS — Z114 Encounter for screening for human immunodeficiency virus [HIV]: Secondary | ICD-10-CM

## 2024-01-25 DIAGNOSIS — Z7689 Persons encountering health services in other specified circumstances: Secondary | ICD-10-CM

## 2024-01-25 DIAGNOSIS — Z1211 Encounter for screening for malignant neoplasm of colon: Secondary | ICD-10-CM

## 2024-01-25 MED ORDER — KETOROLAC TROMETHAMINE 30 MG/ML IJ SOLN: 30.0000 mg | Freq: Once | INTRAMUSCULAR | Status: AC

## 2024-01-25 MED ORDER — TOPIRAMATE 25 MG PO TABS: 25.0000 mg | ORAL_TABLET | Freq: Every day | ORAL | 0 refills | Status: AC

## 2024-01-25 MED ORDER — TRIAMCINOLONE ACETONIDE 40 MG/ML IJ SUSP: 40.0000 mg | Freq: Once | INTRAMUSCULAR | Status: AC

## 2024-01-25 MED ORDER — SUMATRIPTAN SUCCINATE 25 MG PO TABS: ORAL_TABLET | ORAL | 2 refills | Status: AC

## 2024-01-25 NOTE — Progress Notes (Signed)
 Subjective:    Erika Garcia - 48 y.o. female MRN [id number]  Date of birth: 1975/12/22  HPI  Erika Garcia is to establish care.  Current issues and/or concerns: - Headaches. Denies red flag symptoms. She is not taking any over-the-counter medications to help. - Decreased appetite and weight loss persisting. States when she does eat food only "stays on her stomach" for several minutes before she has to vomit.  - Chronic back pain radiating to leg. Denies recent trauma/injury and red flag symptoms. States primarily related to history of 4 epidurals during childbirth.  - Fatigue.  - Reports she is currently menstruating and plans to return at later date for pap smear.  - Established with [facility]. She denies thoughts of self-harm, suicidal ideations, homicidal ideations. - No further issues/concerns for discussion today.  ROS per HPI     Health Maintenance: Health Maintenance Due  Topic Date Due   HIV Screening  Never done   Hepatitis C Screening  Never done   Pneumococcal Vaccine 46-71 Years old (1 of 2 - PCV) Never done   Cervical Cancer Screening (HPV/Pap Cotest)  Never done   Colonoscopy  Never done     Past Medical History: Patient Active Problem List   Diagnosis Date Noted   Major depressive disorder, recurrent, mild (HCC) 08/06/2018   Laceration of left ear 11/23/2015      Social History   reports that she has been smoking. She has never used smokeless tobacco. She reports current alcohol use. She reports current drug use. Frequency: 1.00 time per week. Drug: Marijuana.   Family History  family history is not on file.   Medications: reviewed and updated   Objective:   Physical Exam BP 115/76   Pulse 66   Temp 98.3 F (36.8 C) (Oral)   Resp 16   Ht 5\' 9"  (1.753 m)   Wt 122 lb 12.8 oz (55.7 kg)   LMP 01/23/2024 (Exact Date)   SpO2 98%   BMI 18.13 kg/m   Physical Exam HENT:     Head: Normocephalic and atraumatic.     Nose:  Nose normal.     Mouth/Throat:     Mouth: Mucous membranes are moist.     Pharynx: Oropharynx is clear.  Eyes:     Extraocular Movements: Extraocular movements intact.     Conjunctiva/sclera: Conjunctivae normal.     Pupils: Pupils are equal, round, and reactive to light.  Cardiovascular:     Rate and Rhythm: Normal rate and regular rhythm.     Pulses: Normal pulses.     Heart sounds: Normal heart sounds.  Pulmonary:     Effort: Pulmonary effort is normal.     Breath sounds: Normal breath sounds.  Musculoskeletal:        General: Normal range of motion.     Right shoulder: Normal.     Left shoulder: Normal.     Right upper arm: Normal.     Left upper arm: Normal.     Right elbow: Normal.     Left elbow: Normal.     Right forearm: Normal.     Left forearm: Normal.     Right wrist: Normal.     Left wrist: Normal.     Right hand: Normal.     Left hand: Normal.     Cervical back: Normal, normal range of motion and neck supple.     Thoracic back: Normal.     Lumbar back: Normal.  Right hip: Normal.     Left hip: Normal.     Right upper leg: Normal.     Left upper leg: Normal.     Right knee: Normal.     Left knee: Normal.     Right lower leg: Normal.     Left lower leg: Normal.     Right ankle: Normal.     Left ankle: Normal.     Right foot: Normal.     Left foot: Normal.  Neurological:     General: No focal deficit present.     Mental Status: She is alert and oriented to person, place, and time.  Psychiatric:        Mood and Affect: Mood normal.        Behavior: Behavior normal.        Assessment & Plan:  1. Encounter to establish care (Primary) - Patient presents today to establish care. During the interim follow-up with primary provider as scheduled.  - Return for annual physical examination, labs, and health maintenance. Arrive fasting meaning having no food for at least 8 hours prior to appointment. You may have only water or black coffee. Please take  scheduled medications as normal.  2. Migraine without status migrainosus, not intractable, unspecified migraine type - Topiramate and Sumatriptan as prescribed. Counseled on medication adherence/adverse effects. - Referral to Neurology for evaluation/management.  - Follow-up with primary provider as scheduled.  - SUMAtriptan (IMITREX) 25 MG tablet; Take 25 mg (1 tablet total) by mouth at the start of the headache. May repeat in 2 hours x 1 if headache persists. Max of 2 tablets/24 hours.  Dispense: 30 tablet; Refill: 2 - topiramate (TOPAMAX) 25 MG tablet; Take 1 tablet (25 mg total) by mouth daily.  Dispense: 90 tablet; Refill: 0 - Ambulatory referral to Neurology  3. Decreased appetite - Referral to Medical Weight Management for evaluation/management. - Amb Ref to Medical Weight Management  4. Chronic back pain, unspecified back location, unspecified back pain laterality - Triamcinolone Acetonide and Ketorolac administered in office.  - Patient declined referral to Orthopedics.  - Follow-up with primary provider as scheduled. - triamcinolone acetonide (KENALOG-40) injection 40 mg - ketorolac (TORADOL) 30 MG/ML injection 30 mg  5. Encounter for screening mammogram for malignant neoplasm of breast - Routine screening.  - MM Digital Screening; Future  6. Encounter for screening for HIV - Routine screening.  - HIV antibody (with reflex)  7. Need for hepatitis C screening test - Routine screening.  - Hepatitis C Antibody  8. Colon cancer screening - Referral to Gastroenterology for colon cancer screening by colonoscopy. - Ambulatory referral to Gastroenterology  9. Fatigue, unspecified type - Routine screening.  - TSH - CBC - Vitamin D, 25-hydroxy - CMP14+EGFR  10. Immunization due - Administered. - Pneumococcal conjugate vaccine 20-valent (Prevnar 20)    Patient was given clear instructions to go to Emergency Department or return to medical center if symptoms don't  improve, worsen, or new problems develop.The patient verbalized understanding.  I discussed the assessment and treatment plan with the patient. The patient was provided an opportunity to ask questions and all were answered. The patient agreed with the plan and demonstrated an understanding of the instructions.   The patient was advised to call back or seek an in-person evaluation if the symptoms worsen or if the condition fails to improve as anticipated.    Ricky Stabs, NP 01/25/2024, 12:11 PM Primary Care at [facility]

## 2024-01-25 NOTE — Progress Notes (Signed)
 Pap smear, headaches, lower back pain and leg pain,no appetite, losing a lot of weight, very fatigue

## 2024-01-26 LAB — CMP14+EGFR
ALT: 7 IU/L (ref 0–32)
AST: 15 IU/L (ref 0–40)
Albumin: 4.7 g/dL (ref 3.9–4.9)
Alkaline Phosphatase: 58 IU/L (ref 44–121)
BUN/Creatinine Ratio: 14 (ref 9–23)
BUN: 11 mg/dL (ref 6–24)
Bilirubin Total: <0.2 mg/dL (ref 0.0–1.2)
CO2: 20 mmol/L (ref 20–29)
Calcium: 9.1 mg/dL (ref 8.7–10.2)
Chloride: 102 mmol/L (ref 96–106)
Creatinine, Ser: 0.81 mg/dL (ref 0.57–1.00)
Globulin, Total: 2.9 g/dL (ref 1.5–4.5)
Glucose: 72 mg/dL (ref 70–99)
Potassium: 4.1 mmol/L (ref 3.5–5.2)
Sodium: 141 mmol/L (ref 134–144)
Total Protein: 7.6 g/dL (ref 6.0–8.5)
eGFR: 91 mL/min/{1.73_m2} (ref >59)

## 2024-01-26 LAB — CBC
Hematocrit: 38.7 % (ref 34.0–46.6)
Hemoglobin: 11.4 g/dL (ref 11.1–15.9)
MCH: 24.8 pg — ABNORMAL LOW (ref 26.6–33.0)
MCHC: 29.5 g/dL — ABNORMAL LOW (ref 31.5–35.7)
MCV: 84 fL (ref 79–97)
Platelets: 366 10*3/uL (ref 150–450)
RBC: 4.59 x10E6/uL (ref 3.77–5.28)
RDW: 14.7 % (ref 11.7–15.4)
WBC: 10.4 10*3/uL (ref 3.4–10.8)

## 2024-01-26 LAB — TSH: TSH: 1.02 u[IU]/mL (ref 0.450–4.500)

## 2024-01-26 LAB — HIV ANTIBODY (ROUTINE TESTING W REFLEX): HIV Screen 4th Generation wRfx: NONREACTIVE

## 2024-01-26 LAB — VITAMIN D 25 HYDROXY (VIT D DEFICIENCY, FRACTURES): Vit D, 25-Hydroxy: 12.3 ng/mL — ABNORMAL LOW (ref 30.0–100.0)

## 2024-01-30 ENCOUNTER — Other Ambulatory Visit: Payer: Self-pay | Admitting: Family

## 2024-01-30 ENCOUNTER — Encounter: Payer: Self-pay | Admitting: Family

## 2024-01-30 DIAGNOSIS — E559 Vitamin D deficiency, unspecified: Secondary | ICD-10-CM

## 2024-01-30 MED ORDER — VITAMIN D (ERGOCALCIFEROL) 1.25 MG (50000 UNIT) PO CAPS: 50000.0000 [IU] | ORAL_CAPSULE | ORAL | 0 refills | Status: AC

## 2024-01-31 ENCOUNTER — Encounter: Payer: Self-pay | Admitting: Emergency Medicine

## 2024-02-08 ENCOUNTER — Encounter (INDEPENDENT_AMBULATORY_CARE_PROVIDER_SITE_OTHER): Payer: Self-pay

## 2024-02-15 ENCOUNTER — Encounter (INDEPENDENT_AMBULATORY_CARE_PROVIDER_SITE_OTHER): Payer: Self-pay

## 2024-02-23 ENCOUNTER — Encounter: Payer: Self-pay | Admitting: Family

## 2024-02-27 ENCOUNTER — Encounter: Admitting: Family

## 2024-02-27 NOTE — Progress Notes (Signed)
 Erroneous encounter-disregard

## 2024-02-28 ENCOUNTER — Encounter: Payer: Self-pay | Admitting: Gastroenterology

## 2024-02-28 ENCOUNTER — Ambulatory Visit: Payer: Self-pay | Admitting: Family

## 2024-02-28 ENCOUNTER — Encounter: Payer: Self-pay | Admitting: Neurology

## 2024-02-28 NOTE — Telephone Encounter (Signed)
 Spoke with patient.  She was offered and denied the appointment available for the next day, Feb 28, 2024.  Advised if sxs worsen to go to [facility] or ED.  Patient verbalized understanding.

## 2024-02-28 NOTE — Telephone Encounter (Signed)
 Chief Complaint: Dizziness for one month, worsening  Symptoms: Cold air causes body aches, lightheadedness, vertigo, severe intermittent pain under left breast x1 week Frequency: Intermittent Pertinent Negatives: Patient denies falling  Disposition: [] ED /[] Urgent Care (no appt availability in office) / [] Appointment(In office/virtual)/ []  [facility]/ [] Home Care/ [] Refused Recommended Disposition /[] [facility]/ []  Follow-up with PCP  Additional Notes: Patient states she was in the shower today and felt like she was "going to have a seizure." Pt states she has been having dizziness since last appointment in April with PCP.  Pt denies falling.   Call dropped in middle of conversation. This RN made attempts to contact both numbers on patient's chart with no answer. Will route to call back.     Copied from CRM [id number]. Topic: Clinical - Red Word Triage >> Feb 28, 2024 11:58 AM Hassie Lint wrote: Red Word that prompted transfer to Nurse Triage: Patient states since the end of April she has been feeling dizzy when she stands, when she gets overheated she feels like she is going to have a seizure, and when she turns the air on she gets bad body aches. Also having issues with her appetite. States she is only hungry and eat when she smokes mariajuana. Answer Assessment - Initial Assessment Questions 1. DESCRIPTION: "Describe your dizziness."     *No Answer* 2. LIGHTHEADED: "Do you feel lightheaded?" (e.g., somewhat faint, woozy, weak upon standing)     *No Answer* 3. VERTIGO: "Do you feel like either you or the room is spinning or tilting?" (i.e. vertigo)     *No Answer* 4. SEVERITY: "How bad is it?"  "Do you feel like you are going to faint?" "Can you stand and walk?"   - MILD: Feels slightly dizzy, but walking normally.   - MODERATE: Feels unsteady when walking, but not falling; interferes with normal activities (e.g., school, work).   - SEVERE: Unable to walk without  falling, or requires assistance to walk without falling; feels like passing out now.      *No Answer* 5. ONSET:  "When did the dizziness begin?"     *No Answer* 6. AGGRAVATING FACTORS: "Does anything make it worse?" (e.g., standing, change in head position)     *No Answer* 7. HEART RATE: "Can you tell me your heart rate?" "How many beats in 15 seconds?"  (Note: not all patients can do this)       *No Answer* 8. CAUSE: "What do you think is causing the dizziness?"     *No Answer* 9. RECURRENT SYMPTOM: "Have you had dizziness before?" If Yes, ask: "When was the last time?" "What happened that time?"     *No Answer* 10. OTHER SYMPTOMS: "Do you have any other symptoms?" (e.g., fever, chest pain, vomiting, diarrhea, bleeding)       *No Answer* 11. PREGNANCY: "Is there any chance you are pregnant?" "When was your last menstrual period?"       *No Answer*  Answer Assessment - Initial Assessment Questions Chief Complaint: Dizziness for one month, worsening  Symptoms: Cold air causes body aches, lightheadedness, vertigo, severe intermittent pain under left breast x1 week  Frequency: Intermittent  Pertinent Negatives: Patient denies falling  Protocols used: Dizziness - Lightheadedness-A-AH, Chest Pain-A-AH

## 2024-02-28 NOTE — Telephone Encounter (Signed)
 This RN attempted third attempt to contact patient. Rang twice and stated 'call cannot be completed as dialed'. Will forward to office for further follow up.

## 2024-02-28 NOTE — Telephone Encounter (Signed)
 Agent transferred call to nurse: while verifying patient information and information shared in triage note call was disconnected therefore unable to complete original nurse's triage nor provide disposition for patient.

## 2024-03-26 ENCOUNTER — Ambulatory Visit: Admitting: Family Medicine

## 2024-03-27 ENCOUNTER — Ambulatory Visit: Payer: MEDICAID

## 2024-03-27 VITALS — Ht 69.0 in | Wt 121.0 lb

## 2024-03-27 DIAGNOSIS — Z1211 Encounter for screening for malignant neoplasm of colon: Secondary | ICD-10-CM

## 2024-03-27 MED ORDER — NA SULFATE-K SULFATE-MG SULF 17.5-3.13-1.6 GM/177ML PO SOLN: 1.0000 | Freq: Once | ORAL | 0 refills | Status: AC

## 2024-03-27 NOTE — Progress Notes (Signed)
 No egg or soy allergy known to patient  No issues known to pt with past sedation with any surgeries or procedures Patient denies ever being told they had issues or difficulty with intubation  No FH of Malignant Hyperthermia Pt is not on diet pills Pt is not on  home 02  Pt is not on blood thinners  Pt denies issues with constipation  No A fib or A flutter Have any cardiac testing pending--No Pt can ambulate  Pt denies use of chewing tobacco Discussed diabetic I weight loss medication holds Discussed NSAID holds Checked BMI Last seizure 10/2023.  She was instructed to call [facility] if she has a seizure before colonoscopy and she verbalized understanding.  Pt instructed to use [employer] or GoodRx for a price reduction on prep  Patient's chart reviewed by Rogena Class CNRA prior to previsit and patient appropriate for the [facility].  Pre visit completed and red dot placed by patient's name on their procedure day (on provider's schedule).

## 2024-04-10 ENCOUNTER — Encounter: Admitting: Gastroenterology

## 2024-07-09 NOTE — Progress Notes (Deleted)
 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  History supplemented by referring provider's note.  Headaches: Onset:  *** Location:  *** Quality:  *** Intensity:  ***.  Aura:  *** Prodrome:  *** Postdrome:  *** Associated symptoms:  ***.  *** denies associated unilateral numbness or weakness. Duration:  *** Frequency:  *** Frequency of abortive medication: *** Triggers:  *** Relieving factors:  *** Activity:  ***  History of seizures vs pseudoseizures: She reports that she was diagnosed with seizures at age 65.  ***.  She had been seen in the ED on 07/24/2022 and 10/10/2023 for seizure-like activity in setting of medication noncompliance.   CT head on 07/24/2022 was unremarkable.  Past NSAIDS/analgesics:  naproxen , acetaminophen , tramadol  Past abortive triptans:  sumatriptan  tab Past abortive ergotamine:  none Past muscle relaxants:  none Past anti-emetic:  ondansetron  Past antihypertensive medications:  none Past antidepressant medications:  mirtazapine , sertraline , escitalopram  Past anticonvulsant medications:  topiramate , divalproex , lamotrigine, levetiracetam , gabapent Past anti-CGRP:  none Past vitamins/Herbal/Supplements:  none Past antihistamines/decongestants:  diphenhydramine Other past therapies:  ***  Current NSAIDS/analgesics:  *** Current triptans:  none Current ergotamine:  none Current anti-emetic:  none Current muscle relaxants:  none Current Antihypertensive medications:  prazosin Current Antidepressant medications:  none Current Anticonvulsant medications:  none Current anti-CGRP:   none Current Vitamins/Herbal/Supplements:  none Current Antihistamines/Decongestants:  none Other therapy:  none Birth control:  ***   Caffeine:  *** Alcohol:  *** Smoker:  *** Diet:  *** Exercise:  *** Depression:  ***; Anxiety:  *** Other pain:  *** Sleep hygiene:  ***  History of TBI/concussion:  *** Family history of headache:  *** Family history of cerebral aneurysm:  ***      PAST MEDICAL HISTORY: Past Medical History:  Diagnosis Date   Anxiety    Asthma    Bipolar 1 disorder (HCC)    Depression    Seizure (HCC)    Seizures (HCC)     PAST SURGICAL HISTORY: Past Surgical History:  Procedure Laterality Date   TUBAL LIGATION      MEDICATIONS: Current Outpatient Medications on File Prior to Visit  Medication Sig Dispense Refill   ARIPiprazole  (ABILIFY ) 10 MG tablet Take 10 mg by mouth at bedtime. (Patient not taking: Reported on 03/27/2024)     ARIPiprazole  (ABILIFY ) 15 MG tablet Take 15 mg by mouth daily.     escitalopram  (LEXAPRO ) 10 MG tablet Take 10 mg by mouth daily. (Patient not taking: Reported on 03/27/2024)     mirtazapine  (REMERON ) 15 MG tablet Take 15 mg by mouth at bedtime. (Patient not taking: Reported on 01/25/2024)     mirtazapine  (REMERON ) 30 MG tablet Take 30 mg by mouth at bedtime.     prazosin (MINIPRESS) 1 MG capsule Take 1 mg by mouth at bedtime.     SUMAtriptan  (IMITREX ) 25 MG tablet Take 25 mg (1 tablet total) by mouth at the start of the headache. May repeat in 2 hours x 1 if headache persists. Max of 2 tablets/24 hours. (Patient not taking: Reported on 03/27/2024) 30 tablet 2   topiramate  (TOPAMAX ) 25 MG tablet Take 1 tablet (25 mg total) by mouth  daily. (Patient not taking: Reported on 03/27/2024) 90 tablet 0   Current Facility-Administered Medications on File Prior to Visit  Medication Dose Route Frequency Provider Last Rate Last Admin   triamcinolone  acetonide (KENALOG -40) injection 40 mg  40 mg Intramuscular Once          ALLERGIES: Allergies  Allergen Reactions   Fluoxetine Other (See Comments)    hives    FAMILY HISTORY: Family History  Problem Relation Age of Onset   Stomach cancer Maternal Cousin    Colon cancer Neg Hx    Rectal cancer Neg Hx    Esophageal cancer Neg Hx     Objective:  *** General: No acute distress.  Patient appears well-groomed.   Head:  Normocephalic/atraumatic Eyes:  fundi examined but not visualized Neck: supple, no paraspinal tenderness, full range of motion Heart: regular rate and rhythm Neurological Exam: Mental status: alert and oriented to person, place, and time, speech fluent and not dysarthric, language intact. Cranial nerves: CN I: not tested CN II: pupils equal, round and reactive to light, visual fields intact CN III, IV, VI:  full range of motion, no nystagmus, no ptosis CN V: facial sensation intact. CN VII: upper and lower face symmetric CN VIII: hearing intact CN IX, X: gag intact, uvula midline CN XI: sternocleidomastoid and trapezius muscles intact CN XII: tongue midline Bulk & Tone: normal, no fasciculations. Motor:  muscle strength 5/5 throughout Sensation:  Pinprick and vibratory sensation intact. Deep Tendon Reflexes:  2+ throughout,  toes downgoing.   Finger to nose testing:  Without dysmetria.   Gait:  Normal station and stride.  Romberg negative.    Thank you for allowing me to take part in the care of this patient.  Juliene Dunnings, DO  CC: ***

## 2024-07-10 ENCOUNTER — Ambulatory Visit: Payer: MEDICAID | Admitting: Neurology

## 2024-07-10 ENCOUNTER — Encounter: Payer: Self-pay | Admitting: Neurology
